# Patient Record
Sex: Female | Born: 1975 | Race: Black or African American | Hispanic: No | Smoking: Never smoker
Health system: Southern US, Community
[De-identification: ages and names within clinical notes are randomized; demographics above are authoritative.]

## PROBLEM LIST (undated history)

## (undated) DIAGNOSIS — E119 Type 2 diabetes mellitus without complications: Secondary | ICD-10-CM

## (undated) HISTORY — PX: TONSILLECTOMY: SUR1361

## (undated) HISTORY — PX: CHOLECYSTECTOMY: SHX55

## (undated) HISTORY — PX: TUBAL LIGATION: SHX77

---

## 2020-07-28 ENCOUNTER — Emergency Department
Admission: RE | Admit: 2020-07-28 | Discharge: 2020-07-28 | Disposition: A | Discharge status: Home / Self Care | Payer: Self-pay | Source: Ambulatory Visit | Attending: Family Medicine | Admitting: Family Medicine

## 2020-07-28 ENCOUNTER — Other Ambulatory Visit: Payer: Self-pay

## 2020-07-28 ENCOUNTER — Emergency Department (INDEPENDENT_AMBULATORY_CARE_PROVIDER_SITE_OTHER): Payer: Self-pay

## 2020-07-28 VITALS — BP 136/87 | HR 85 | Temp 98.2°F | Resp 16 | Ht 61.0 in | Wt 170.0 lb

## 2020-07-28 DIAGNOSIS — K760 Fatty (change of) liver, not elsewhere classified: Secondary | ICD-10-CM

## 2020-07-28 DIAGNOSIS — D219 Benign neoplasm of connective and other soft tissue, unspecified: Secondary | ICD-10-CM

## 2020-07-28 DIAGNOSIS — D259 Leiomyoma of uterus, unspecified: Secondary | ICD-10-CM

## 2020-07-28 DIAGNOSIS — R1031 Right lower quadrant pain: Secondary | ICD-10-CM

## 2020-07-28 DIAGNOSIS — K429 Umbilical hernia without obstruction or gangrene: Secondary | ICD-10-CM

## 2020-07-28 HISTORY — DX: Type 2 diabetes mellitus without complications: E11.9

## 2020-07-28 LAB — POCT URINALYSIS DIP (MANUAL ENTRY)
Bilirubin, UA: NEGATIVE
Blood, UA: NEGATIVE
Glucose, UA: NEGATIVE mg/dL
Ketones, POC UA: NEGATIVE mg/dL
Leukocytes, UA: NEGATIVE
Nitrite, UA: NEGATIVE
Protein Ur, POC: NEGATIVE mg/dL
Spec Grav, UA: >=1.03 — AB (ref 1.010–1.025)
Urobilinogen, UA: 0.2 E.U./dL
pH, UA: 5.5 (ref 5.0–8.0)

## 2020-07-28 LAB — I-STAT CREATININE (MANUAL ENTRY): Creatinine, Ser: 0.8 (ref 0.50–1.10)

## 2020-07-28 MED ORDER — KETOROLAC TROMETHAMINE 60 MG/2ML IM SOLN
60.0000 mg | Freq: Once | INTRAMUSCULAR | Status: AC
  Administered 2020-07-28: 60 mg via INTRAMUSCULAR

## 2020-07-28 MED ORDER — IOHEXOL 9 MG/ML PO SOLN: 500.0000 mL | ORAL | Status: AC

## 2020-07-28 MED ORDER — IOHEXOL 300 MG/ML  SOLN
100.0000 mL | Freq: Once | INTRAMUSCULAR | Status: AC | PRN
  Administered 2020-07-28: 100 mL via INTRAVENOUS

## 2020-07-28 NOTE — ED Notes (Signed)
Pt's labs to be drawn after PIV placed for CT - per pt she has small veins & is afraid of needles- labs are a send out - Dr Assunta Found aware labs will be drawn after CT - scheduled for 1500. Pt also requested RN update her son vis phone with testing status. Pt is unable to get a signal in room. RN spoke w/ Tino who plans on leaving to pick up children from school at [place] and will return afterwards. Patient updated on plan

## 2020-07-28 NOTE — ED Provider Notes (Signed)
[name]    CSN: [id number] Arrival date & time: 07/28/20  1153      History   Chief Complaint No chief complaint on file.   HPI Shelley Smith is a 45 y.o. female.   Patient recently moved to this area and does not yet have a PCP.  She complains of onset of sporadic right lower quadrant abdominal pain about 4 months ago.  She describes the pain as a "punching" sensation.  The pain has become worse, and constant, during the past two weeks, often awaking her.  She complains of post-prandial bloating but denies nausea/vomiting and bowel movements have been normal except for minimal constipation.  She denies urinary symptoms.  Patient's last menstrual period was 07/16/2020 (exact date).  She notes that her periods have become increasingly heavy during the past several months.  Ibuprofen improves her symptoms. Past surgical history includes three C-sections (most recent in 2005).  Five years ago she had a cholecystectomy with post-op complication of a lower abdominal abscess requiring repeat surgery.  She had COVID19 pneumonia in February 2020.  The history is provided by the patient.  Abdominal Pain Pain location:  RLQ Pain quality: bloating and squeezing   Pain radiates to:  Does not radiate Pain severity:  Moderate Onset quality:  Gradual Duration:  2 weeks Timing:  Constant Progression:  Worsening Chronicity:  New Context: awakening from sleep, eating and previous surgery   Context: not diet changes, not laxative use, not recent illness and not trauma   Relieved by:  Nothing Worsened by:  Palpation and movement Ineffective treatments:  NSAIDs Associated symptoms: constipation   Associated symptoms: no anorexia, no belching, no chest pain, no chills, no cough, no diarrhea, no dysuria, no fatigue, no fever, no flatus, no hematemesis, no hematochezia, no hematuria, no melena, no nausea, no shortness of breath, no vaginal bleeding, no vaginal discharge and no vomiting    Risk factors: multiple surgeries and obesity     Past Medical History:  Diagnosis Date  . Diabetes mellitus without complication ([place])     There are no problems to display for this patient.   Past Surgical History:  Procedure Laterality Date  . CESAREAN SECTION    . CHOLECYSTECTOMY    . TONSILLECTOMY      OB History   No obstetric history on file.      Home Medications    Prior to Admission medications   Not on File    Family History History reviewed. No pertinent family history.  Social History Social History   Tobacco Use  . Smoking status: Never Smoker  . Smokeless tobacco: Never Used  Vaping Use  . Vaping Use: Never used  Substance Use Topics  . Alcohol use: Not Currently     Allergies   Codeine   Review of Systems Review of Systems  Constitutional: Negative for activity change, appetite change, chills, diaphoresis, fatigue, fever and unexpected weight change.  HENT: Negative.   Eyes: Negative.   Respiratory: Negative for cough, chest tightness and shortness of breath.   Cardiovascular: Negative for chest pain and leg swelling.  Gastrointestinal: Positive for abdominal pain and constipation. Negative for abdominal distention, anorexia, blood in stool, diarrhea, flatus, hematemesis, hematochezia, melena, nausea and vomiting.  Genitourinary: Negative for dysuria, hematuria, menstrual problem, pelvic pain, vaginal bleeding and vaginal discharge.  Musculoskeletal: Negative.   Skin: Negative.   Neurological: Negative for headaches.  Hematological: Negative for adenopathy.     Physical Exam Triage Vital Signs ED  Triage Vitals  Enc Vitals Group     BP 07/28/20 1212 136/87     Pulse Rate 07/28/20 1212 71     Resp 07/28/20 1212 18     Temp 07/28/20 1212 98.2 F (36.8 C)     Temp Source 07/28/20 1212 Oral     SpO2 07/28/20 1212 96 %     Weight 07/28/20 1213 170 lb (77.1 kg)     Height 07/28/20 1213 5\' 1"  (1.549 m)     Head Circumference --       Peak Flow --      Pain Score 07/28/20 1212 8     Pain Loc --      Pain Edu? --      Excl. in [place]? --    No data found.  Updated Vital Signs BP 136/87 (BP Location: Right Arm)   Pulse 71   Temp 98.2 F (36.8 C) (Oral)   Resp 18   Ht 5\' 1"  (1.549 m)   Wt 77.1 kg   LMP 07/16/2020 (Exact Date)   SpO2 96%   BMI 32.12 kg/m   Visual Acuity Right Eye Distance:   Left Eye Distance:   Bilateral Distance:    Right Eye Near:   Left Eye Near:    Bilateral Near:     Physical Exam Vitals and nursing note reviewed.  Constitutional:      General: She is not in acute distress.    Appearance: She is obese.  HENT:     Head: Normocephalic.     Nose: Nose normal.     Mouth/Throat:     Mouth: Mucous membranes are moist.     Pharynx: Oropharynx is clear.  Eyes:     Conjunctiva/sclera: Conjunctivae normal.     Pupils: Pupils are equal, round, and reactive to light.  Cardiovascular:     Rate and Rhythm: Normal rate and regular rhythm.     Heart sounds: Normal heart sounds.  Pulmonary:     Breath sounds: Normal breath sounds.  Abdominal:     General: Bowel sounds are normal. There is no distension.     Palpations: Abdomen is soft. There is no hepatomegaly, splenomegaly or mass.     Tenderness: There is abdominal tenderness in the right lower quadrant and suprapubic area. There is no right CVA tenderness, left CVA tenderness, guarding or rebound. Positive signs include McBurney's sign. Negative signs include psoas sign and obturator sign.     Hernia: No hernia is present. There is no hernia in the umbilical area or ventral area.       Comments: Lower abdomen has tenderness to palpation and fullness over the suprapubic area, extending to the right lower quadrant.  Musculoskeletal:     Cervical back: Neck supple.     Right lower leg: No edema.     Left lower leg: No edema.  Lymphadenopathy:     Cervical: No cervical adenopathy.  Skin:    General: Skin is warm and dry.      Findings: No rash.  Neurological:     Mental Status: She is alert and oriented to person, place, and time.      UC Treatments / Results  Labs (all labs ordered are listed, but only abnormal results are displayed) Labs Reviewed  POCT URINALYSIS DIP (MANUAL ENTRY) - Abnormal; Notable for the following components:      Result Value   Spec Grav, UA >=1.030 (*)    All other components within normal limits  CBC WITH DIFFERENTIAL/PLATELET  COMPLETE METABOLIC PANEL WITH GFR  HEMOGLOBIN A1C    EKG   Radiology CT ABDOMEN PELVIS W CONTRAST  Result Date: 07/28/2020 CLINICAL DATA:  Intermittent pain in the abdomen. EXAM: CT ABDOMEN AND PELVIS WITH CONTRAST TECHNIQUE: Multidetector CT imaging of the abdomen and pelvis was performed using the standard protocol following bolus administration of intravenous contrast. CONTRAST:  18mL OMNIPAQUE IOHEXOL 300 MG/ML  SOLN COMPARISON:  None. FINDINGS: Lower chest: No acute abnormality. Hepatobiliary: The liver is hypoattenuating relative to the spleen, which may reflect hepatic steatosis. No focal liver abnormality is seen. The gallbladder is surgically absent. No biliary dilatation. Pancreas: Unremarkable. No pancreatic ductal dilatation or surrounding inflammatory changes. Spleen: Normal in size without focal abnormality. Adrenals/Urinary Tract: Adrenal glands are unremarkable. Kidneys are normal, without renal calculi, focal lesion, or hydronephrosis. Bladder is unremarkable. Stomach/Bowel: Stomach is within normal limits. Appendix appears normal. No evidence of bowel wall thickening, distention, or inflammatory changes. Vascular/Lymphatic: No significant vascular findings are present. No enlarged abdominal or pelvic lymph nodes. Reproductive: The uterus is markedly enlarged measuring approximately 12.8 x 13.7 (series 2, image 51) x 14.0 cm (series 6, image 51) with multiple uterine masses, consistent with uterine fibroids. Other: There is a fat containing  paraumbilical hernia. No free intraperitoneal fluid. Musculoskeletal: No acute or significant osseous findings. IMPRESSION: 1. Markedly enlarged uterus with multiple uterine fibroids. 2. Fat containing paraumbilical hernia. 3. Hepatic steatosis. Electronically Signed   By: Zerita Boers M.D.   On: 07/28/2020 16:35    Procedures Procedures (including critical care time)  Medications Ordered in UC Medications  ketorolac (TORADOL) injection 60 mg (60 mg Intramuscular Given 07/28/20 1420)    Initial Impression / Assessment and Plan / UC Course  I have reviewed the triage vital signs and the nursing notes.  Pertinent labs & imaging results that were available during my care of the patient were reviewed by me and considered in my medical decision making (see chart for details).    No significant findings on CT abd/pelvis other than multiple uterine fibroids which is most likely the source of patient's symptoms. CBC and CMP pending.  Hgb A1C at patient's request. Recommend follow-up with [facility], and a PCP to establish care as soon as possible.    Final Clinical Impressions(s) / UC Diagnoses   Final diagnoses:  Right lower quadrant abdominal pain  Fibroids     Discharge Instructions     May take Ibuprofen 200mg , 4 tabs every 8 hours with food as needed for pain.   ED Prescriptions    None        Kandra Nicolas, MD 07/29/20 2124

## 2020-07-28 NOTE — Discharge Instructions (Addendum)
May take Ibuprofen 200mg , 4 tabs every 8 hours with food as needed for pain.

## 2020-07-28 NOTE — ED Triage Notes (Signed)
Abdominal pain, Lower right quadrant x 2 weeks intermittently, mostly at night,  bloating after eating, does not have a gall bladder. Denies constipation, diarrhea or urinary sx.

## 2020-07-29 LAB — COMPLETE METABOLIC PANEL WITH GFR
AG Ratio: 1.4 (calc) (ref 1.0–2.5)
ALT: 16 U/L (ref 6–29)
AST: 13 U/L (ref 10–35)
Albumin: 4 g/dL (ref 3.6–5.1)
Alkaline phosphatase (APISO): 44 U/L (ref 31–125)
BUN: 11 mg/dL (ref 7–25)
CO2: 23 mmol/L (ref 20–32)
Calcium: 8.8 mg/dL (ref 8.6–10.2)
Chloride: 99 mmol/L (ref 98–110)
Creat: 0.85 mg/dL (ref 0.50–1.10)
GFR, Est African American: 95 mL/min/{1.73_m2} (ref >=60)
GFR, Est Non African American: 82 mL/min/{1.73_m2} (ref >=60)
Globulin: 2.8 g/dL (calc) (ref 1.9–3.7)
Glucose, Bld: 87 mg/dL (ref 65–99)
Potassium: 4.1 mmol/L (ref 3.5–5.3)
Sodium: 134 mmol/L — ABNORMAL LOW (ref 135–146)
Total Bilirubin: 0.5 mg/dL (ref 0.2–1.2)
Total Protein: 6.8 g/dL (ref 6.1–8.1)

## 2020-07-29 LAB — CBC WITH DIFFERENTIAL/PLATELET
Absolute Monocytes: 455 cells/uL (ref 200–950)
Basophils Absolute: 36 cells/uL (ref 0–200)
Basophils Relative: 0.4 %
Eosinophils Absolute: 82 cells/uL (ref 15–500)
Eosinophils Relative: 0.9 %
HCT: 30.3 % — ABNORMAL LOW (ref 35.0–45.0)
Hemoglobin: 9.6 g/dL — ABNORMAL LOW (ref 11.7–15.5)
Lymphs Abs: 3322 cells/uL (ref 850–3900)
MCH: 24.5 pg — ABNORMAL LOW (ref 27.0–33.0)
MCHC: 31.7 g/dL — ABNORMAL LOW (ref 32.0–36.0)
MCV: 77.3 fL — ABNORMAL LOW (ref 80.0–100.0)
MPV: 9.8 fL (ref 7.5–12.5)
Monocytes Relative: 5 %
Neutro Abs: 5205 cells/uL (ref 1500–7800)
Neutrophils Relative %: 57.2 %
Platelets: 433 10*3/uL — ABNORMAL HIGH (ref 140–400)
RBC: 3.92 10*6/uL (ref 3.80–5.10)
RDW: 15.2 % — ABNORMAL HIGH (ref 11.0–15.0)
Total Lymphocyte: 36.5 %
WBC: 9.1 10*3/uL (ref 3.8–10.8)

## 2020-07-29 LAB — HEMOGLOBIN A1C
Hgb A1c MFr Bld: 7.1 % of total Hgb — ABNORMAL HIGH (ref <5.7)
Mean Plasma Glucose: 157 mg/dL
eAG (mmol/L): 8.7 mmol/L

## 2020-08-20 ENCOUNTER — Other Ambulatory Visit (HOSPITAL_COMMUNITY)
Admission: RE | Admit: 2020-08-20 | Discharge: 2020-08-20 | Disposition: A | Discharge status: Home / Self Care | Payer: Medicaid Other | Source: Ambulatory Visit | Attending: Obstetrics and Gynecology | Admitting: Obstetrics and Gynecology

## 2020-08-20 ENCOUNTER — Encounter: Payer: Self-pay | Admitting: Obstetrics and Gynecology

## 2020-08-20 ENCOUNTER — Other Ambulatory Visit: Payer: Self-pay

## 2020-08-20 ENCOUNTER — Ambulatory Visit (INDEPENDENT_AMBULATORY_CARE_PROVIDER_SITE_OTHER): Payer: Medicaid Other | Admitting: Obstetrics and Gynecology

## 2020-08-20 VITALS — BP 146/81 | HR 49 | Ht 61.0 in | Wt 176.0 lb

## 2020-08-20 DIAGNOSIS — Z01411 Encounter for gynecological examination (general) (routine) with abnormal findings: Secondary | ICD-10-CM | POA: Diagnosis not present

## 2020-08-20 DIAGNOSIS — Z01419 Encounter for gynecological examination (general) (routine) without abnormal findings: Secondary | ICD-10-CM | POA: Diagnosis not present

## 2020-08-20 DIAGNOSIS — D259 Leiomyoma of uterus, unspecified: Secondary | ICD-10-CM | POA: Diagnosis not present

## 2020-08-20 NOTE — Progress Notes (Signed)
Subjective:     Shelley Smith is a 45 y.o. female P3 with LMP 07/16/20 and BMI 33 who is here for a comprehensive physical exam. The patient reports chronic pelvic pain intermittently. Patient states the pain has been present for years. She was recently seen in urgent care and was informed of a fibroid uterus. She reports a monthly period lasting 7 days associated with dysmenorrhea. Patient is sexually active using BTL for contraception. She denies any abnormal discharge. Patient is without any other complaints.   Past Medical History:  Diagnosis Date  . Diabetes mellitus without complication [facility])    Past Surgical History:  Procedure Laterality Date  . CESAREAN SECTION    . CHOLECYSTECTOMY    . TONSILLECTOMY    . TUBAL LIGATION     Family History  Problem Relation Age of Onset  . Cancer Maternal Uncle      Social History   Socioeconomic History  . Marital status: Single    Spouse name: Not on file  . Number of children: Not on file  . Years of education: Not on file  . Highest education level: Not on file  Occupational History  . Not on file  Tobacco Use  . Smoking status: Never Smoker  . Smokeless tobacco: Never Used  Vaping Use  . Vaping Use: Never used  Substance and Sexual Activity  . Alcohol use: Not Currently  . Drug use: Never  . Sexual activity: Yes    Birth control/protection: Surgical  Other Topics Concern  . Not on file  Social History Narrative  . Not on file   Social Determinants of Health   Financial Resource Strain: Not on file  Food Insecurity: Not on file  Transportation Needs: Not on file  Physical Activity: Not on file  Stress: Not on file  Social Connections: Not on file  Intimate Partner Violence: Not on file   Health Maintenance  Topic Date Due  . Hepatitis C Screening  Never done  . COVID-19 Vaccine (1) Never done  . HIV Screening  Never done  . TETANUS/TDAP  Never done  . PAP SMEAR-Modifier  Never done  . COLONOSCOPY (Pts  45-55yrs Insurance coverage will need to be confirmed)  Never done  . INFLUENZA VACCINE  Never done  . HPV VACCINES  Aged Out       Review of Systems Pertinent items noted in HPI and remainder of comprehensive ROS otherwise negative.   Objective:  Blood pressure (!) 146/81, pulse (!) 49, height 5\' 1"  (1.549 m), weight 176 lb (79.8 kg), last menstrual period 08/10/2020.     GENERAL: Well-developed, well-nourished female in no acute distress.  HEENT: Normocephalic, atraumatic. Sclerae anicteric.  NECK: Supple. Normal thyroid.  LUNGS: Clear to auscultation bilaterally.  HEART: Regular rate and rhythm. BREASTS: Symmetric in size. No palpable masses or lymphadenopathy, skin changes, or nipple drainage. ABDOMEN: Soft, nontender, nondistended. No organomegaly. palpable fibroid uterus. Vertical skin incision from c-section PELVIC: Normal external female genitalia. Vagina is pink and rugated.  Normal discharge. Normal appearing cervix. Uterus is18-weeks in size.  No adnexal mass or tenderness. EXTREMITIES: No cyanosis, clubbing, or edema, 2+ distal pulses.    Assessment:    Healthy female exam.      Plan:    Pap smear collected Screening mammogram ordered Pelvic ultrasound ordered Patient hesitant on surgical intervention. Pending ultrasound findings, discussed management with uterine artery embolization vs hysterectomy. Patient is interested in robotic hysterectomy if it is an option Patient will be contacted  with ultrasound report and further management Patient will be contacted with other abnormal results  See After Visit Summary for Counseling Recommendations

## 2020-08-20 NOTE — Patient Instructions (Signed)
Uterine Artery Embolization for Fibroids  Uterine artery embolization is a procedure to shrink uterine fibroids. Uterine fibroids are masses of tissue (tumors) that can develop in the womb (uterus). They are also called leiomyomas. This type of tumor is not cancerous (benign) and does not spread to other parts of the body outside of the pelvic area. The pelvic area is the part of the body between the hip bones. You can have one or many fibroids. Fibroids can vary in size, shape, weight, and where they grow in the uterus. Some can become quite large. In this procedure, a thin plastic tube (catheter) is used to inject a chemical that blocks off the blood supply to the fibroid, which causes the fibroid to shrink. Tell a health care provider about:  Any allergies you have.  All medicines you are taking, including vitamins, herbs, eye drops, creams, and over-the-counter medicines.  Any problems you or family members have had with anesthetic medicines.  Any blood disorders you have.  Any surgeries you have had.  Any medical conditions you have.  Whether you are pregnant or may be pregnant. What are the risks? Generally, this is a safe procedure. However, problems may occur, including:  Bleeding.  Allergic reactions to medicines or dyes.  Damage to other structures or organs.  Infection, including blood infection (septicemia).  Injury to the uterus from decreased blood supply.  Lack of menstrual periods (amenorrhea).  Death of tissue cells (necrosis) around your bladder or vulva.  Development of a hole between organs or from an organ to the surface of your skin (fistula).  Blood clot in the legs (deep vein thrombosis) or lung (pulmonary embolus).  Nausea and vomiting. What happens before the procedure? Staying hydrated Follow instructions from your health care provider about hydration, which may include:  Up to 2 hours before the procedure - you may continue to drink clear  liquids, such as water, clear fruit juice, black coffee, and plain tea. Eating and drinking restrictions Follow instructions from your health care provider about eating and drinking, which may include:  8 hours before the procedure - stop eating heavy meals or foods such as meat, fried foods, or fatty foods.  6 hours before the procedure - stop eating light meals or foods, such as toast or cereal.  6 hours before the procedure - stop drinking milk or drinks that contain milk.  2 hours before the procedure - stop drinking clear liquids. Medicines  Ask your health care provider about: ? Changing or stopping your regular medicines. This is especially important if you are taking diabetes medicines or blood thinners. ? Taking over-the-counter medicines, vitamins, herbs, and supplements. ? Taking medicines such as aspirin and ibuprofen. These medicines can thin your blood. Do not take these medicines unless your health care provider tells you to take them.  You may be given antibiotic medicine to help prevent infection.  You may be given medicine to prevent nausea and vomiting (antiemetic). General instructions  Ask your health care provider how your surgical site will be marked or identified.  You may be asked to shower with a germ-killing soap.  Plan to have someone take you home from the hospital or clinic.  If you will be going home right after the procedure, plan to have someone with you for 24 hours.  You will be asked to empty your bladder. What happens during the procedure?  To lower your risk of infection: ? Your health care team will wash or sanitize their hands. ?   to help you relax (sedative). A medicine to numb the area (local anesthetic). A small cut (incision) will be made in your groin. A catheter will be inserted into the main artery of your leg. The catheter  will be guided through the artery to your uterus. A series of images will be taken while dye is injected through the catheter in your groin. X-rays are taken at the same time. This is done to provide a road map of the blood supply to your uterus and fibroids. Tiny plastic spheres, about the size of sand grains, will be injected through the catheter. Metal coils may be used to help block the artery. The particles will lodge in tiny branches of the uterine artery that supplies blood to the fibroids. The procedure will be repeated on the artery that supplies the other side of the uterus. The catheter will be removed and pressure will be applied to stop the bleeding. A dressing will be placed over the incision. The procedure may vary among health care providers and hospitals. What happens after the procedure? Your blood pressure, heart rate, breathing rate, and blood oxygen level will be monitored until the medicines you were given have worn off. You will be given pain medicine as needed. You may be given medicine for nausea and vomiting as needed. Do not drive for 24 hours if you were given a sedative. Summary Uterine artery embolization is a procedure to shrink uterine fibroids by blocking the blood supply to the fibroid. You may be given a sedative and local anesthetic for the procedure. A catheter will be inserted into the main artery of your leg. The catheter will be guided through the artery to your uterus. After the procedure you will be given pain medicine and medicine for nausea as needed. Do not drive for 24 hours if you were given a sedative. This information is not intended to replace advice given to you by your health care provider. Make sure you discuss any questions you have with your healthcare provider. Document Revised: 05/12/2017 Document Reviewed: 09/01/2016 Elsevier Patient Education  2021 [employer] Hysterectomy Information  A hysterectomy is a surgery in which the uterus  is removed. The lowest part of the uterus (cervix), which opens into the vagina, may be removed as well. In some cases, the fallopian tubes, the ovaries, or both the fallopian tubes and the ovaries may also be removed. This procedure may be done to treat different medical problems. It may also be done to help transgender men feel more masculine. After the procedure, a woman will no longer have menstrual periods and will not be able to become pregnant (sterile). What are the reasons for a hysterectomy? There are many reasons why a person might have this procedure. They include: Persistent, abnormal vaginal bleeding. Long-term (chronic) pelvic pain or infection. Endometriosis. This is when the lining of the uterus (endometrium) starts to grow outside the uterus. Adenomyosis. This is when the endometrium starts to grow in the muscle of the uterus. Pelvic organ prolapse. This is a condition in which the uterus falls down into the vagina. Noncancerous growths in the uterus (uterine fibroids) that cause symptoms. The presence of precancerous cells. Cervical or uterine cancer. Sex change. This helps a transgender man complete his female identity. What are the different types of hysterectomy? There are three different types of hysterectomy: Supracervical hysterectomy. In this type, the top part of the uterus is removed, but not the cervix. Total hysterectomy. In this type, the uterus and cervix   are removed. Radical hysterectomy. In this type, the uterus, the cervix, and the tissue that holds the uterus in place (parametrium) are removed. What are the different ways a hysterectomy can be performed? There are many different ways a hysterectomy can be performed, including: Abdominal hysterectomy. In this type, an incision is made in the abdomen. The uterus is removed through this incision. Vaginal hysterectomy. In this type, an incision is made in the vagina. The uterus is removed through this incision.  There are no abdominal incisions. Conventional laparoscopic hysterectomy. In this type, 3 or 4 small incisions are made in the abdomen. A thin, lighted tube with a camera (laparoscope) is inserted into one of the incisions. Other tools are put through the other incisions. The uterus is cut into small pieces. The small pieces are removed through the incisions or the vagina. Laparoscopically assisted vaginal hysterectomy (LAVH). In this type, 3 or 4 small incisions are made in the abdomen. Part of the surgery is performed laparoscopically and the other part is done vaginally. The uterus is removed through the vagina. Robot-assisted laparoscopic hysterectomy. In this type, a laparoscope and other tools are inserted into 3 or 4 small incisions in the abdomen. A computer-controlled device is used to give the surgeon a 3D image and to help control the surgical instruments. This allows for more precise movements of surgical instruments. The uterus is cut into small pieces and removed through the incisions or the vagina. Discuss the options with your health care provider to determine which type isthe right one for you. What are the risks of this surgery? Generally, this is a safe procedure. However, problems may occur, including: Bleeding and risk of blood transfusion. Tell your health care provider if you do not want to receive any blood products. Blood clots in the legs or lung. Infection. Damage to nearby structures or organs. Allergic reactions to medicines. Having to change to an abdominal hysterectomy after starting a less invasive technique. What to expect after a hysterectomy You will be given pain medicine. You may need to stay in the hospital for 1-2 days to recover, depending on the type of hysterectomy you had. You will need to have someone with you for the first 3-5 days after you go home. You will need to follow up with your surgeon in 2-4 weeks after surgery to evaluate your progress. If the  ovaries are removed, you will have early menopause symptoms such as hot flashes, night sweats, and insomnia. If you had a hysterectomy for a problem that was not cancer or a condition that could not lead to cancer, then you no longer need Pap tests. However, even if you no longer need a Pap test, get regular pelvic exams to make sure no otherproblems are developing. Questions to ask your health care provider Is a hysterectomy medically necessary? Do I have other treatment options for my condition? What are my options for hysterectomy procedure? What organs and tissues need to be removed? What are the risks? What are the benefits? How long will I need to stay in the hospital after the procedure? How long will I need to recover at home? What symptoms can I expect after the procedure? Summary A hysterectomy is a surgery in which the uterus is removed. The fallopian tubes, the ovaries, or both may be removed as well. This procedure may be done to treat different medical problems. It may also be done to complete your female identity during a sex change. After the procedure, a woman   will no longer have a menstrual period and will not be able to become pregnant. There are three types of hysterectomy. Discuss with your health care provider which options are right for you. This is a safe procedure, though there are some risks. Risks include infection, bleeding, blood clots, or damage to nearby organs. This information is not intended to replace advice given to you by your health care provider. Make sure you discuss any questions you have with your healthcare provider. Document Revised: 03/29/2019 Document Reviewed: 03/29/2019 Elsevier Patient Education  2021 [employer] Inc.  

## 2020-08-21 ENCOUNTER — Ambulatory Visit (INDEPENDENT_AMBULATORY_CARE_PROVIDER_SITE_OTHER): Payer: Self-pay

## 2020-08-21 ENCOUNTER — Ambulatory Visit (INDEPENDENT_AMBULATORY_CARE_PROVIDER_SITE_OTHER): Payer: Medicaid Other

## 2020-08-21 ENCOUNTER — Other Ambulatory Visit: Payer: Self-pay | Admitting: Obstetrics and Gynecology

## 2020-08-21 DIAGNOSIS — D259 Leiomyoma of uterus, unspecified: Secondary | ICD-10-CM

## 2020-08-21 DIAGNOSIS — Z1231 Encounter for screening mammogram for malignant neoplasm of breast: Secondary | ICD-10-CM

## 2020-08-21 DIAGNOSIS — Z01411 Encounter for gynecological examination (general) (routine) with abnormal findings: Secondary | ICD-10-CM

## 2020-08-24 ENCOUNTER — Other Ambulatory Visit: Payer: Self-pay | Admitting: Obstetrics and Gynecology

## 2020-08-24 DIAGNOSIS — D259 Leiomyoma of uterus, unspecified: Secondary | ICD-10-CM

## 2020-08-25 LAB — CYTOLOGY - PAP
Adequacy: ABSENT
Comment: NEGATIVE
Diagnosis: NEGATIVE
High risk HPV: NEGATIVE

## 2020-10-20 ENCOUNTER — Other Ambulatory Visit: Payer: Self-pay | Admitting: Obstetrics and Gynecology

## 2020-10-20 DIAGNOSIS — D259 Leiomyoma of uterus, unspecified: Secondary | ICD-10-CM

## 2020-10-22 ENCOUNTER — Other Ambulatory Visit: Payer: Self-pay | Admitting: Interventional Radiology

## 2020-10-22 ENCOUNTER — Encounter: Payer: Self-pay | Admitting: *Deleted

## 2020-10-22 ENCOUNTER — Ambulatory Visit
Admission: RE | Admit: 2020-10-22 | Discharge: 2020-10-22 | Disposition: A | Discharge status: Home / Self Care | Payer: Medicaid Other | Source: Ambulatory Visit | Attending: Obstetrics and Gynecology | Admitting: Obstetrics and Gynecology

## 2020-10-22 DIAGNOSIS — D259 Leiomyoma of uterus, unspecified: Secondary | ICD-10-CM

## 2020-10-22 HISTORY — PX: IR RADIOLOGIST EVAL & MGMT: IMG5224

## 2020-10-22 NOTE — Consult Note (Signed)
Chief Complaint:  Symptomatic uterine fibroid  Referring Physician(s): Smith,Shelley  History of Present Illness: Shelley Smith is a 45 y.o. female With symptomatic uterine fibroids and dysfunctional uterine bleeding.  Patient is a G4, P3 A1.  No future pregnancy plans.  Previous tubal ligation.  Review of her menstrual cycle performed last menstrual cycle for 322.  Frequency is every month.  Cycle length is 7 days with 5 heavy days requiring frequent pad change.  No intra-.  Bleeding.  No previous fibroid surgeries or removals.  She also has bulk related symptoms including abdominal pelvic cramping, bloating, and associated decreased appetite.  Pelvic pain is more pronounced in the right lower quadrant.  She is not on any birth control pills or hormone replacement therapies.  No recent GYN infection or antibiotic course.  Last Pap smear 08/20/2020 was negative.  Ultrasound and CT both confirm enlarged fibroid uterus.  Past Medical History:  Diagnosis Date  . Diabetes mellitus without complication [facility])     Past Surgical History:  Procedure Laterality Date  . CESAREAN SECTION    . CHOLECYSTECTOMY    . IR RADIOLOGIST EVAL & MGMT  10/22/2020  . TONSILLECTOMY    . TUBAL LIGATION      Allergies: Codeine  Medications: Prior to Admission medications   Medication Sig Start Date End Date Taking? Authorizing Provider  ferrous sulfate 324 MG TBEC Take 324 mg by mouth.    [provider]     Family History  Problem Relation Age of Onset  . Cancer Maternal Uncle     Social History   Socioeconomic History  . Marital status: Single    Spouse name: Not on file  . Number of children: Not on file  . Years of education: Not on file  . Highest education level: Not on file  Occupational History  . Not on file  Tobacco Use  . Smoking status: Never Smoker  . Smokeless tobacco: Never Used  Vaping Use  . Vaping Use: Never used  Substance and Sexual Activity  .  Alcohol use: Not Currently  . Drug use: Never  . Sexual activity: Yes    Birth control/protection: Surgical  Other Topics Concern  . Not on file  Social History Narrative  . Not on file   Social Determinants of Health   Financial Resource Strain: Not on file  Food Insecurity: Not on file  Transportation Needs: Not on file  Physical Activity: Not on file  Stress: Not on file  Social Connections: Not on file     Review of Systems: A 12 point ROS discussed and pertinent positives are indicated in the HPI above.  All other systems are negative.  Review of Systems  Vital Signs: Pulse (!) 57   SpO2 99%   Physical Exam Constitutional:      General: She is not in acute distress.    Appearance: She is not toxic-appearing or diaphoretic.  Eyes:     General: No scleral icterus.    Conjunctiva/sclera: Conjunctivae normal.  Cardiovascular:     Rate and Rhythm: Normal rate and regular rhythm.     Heart sounds: Normal heart sounds.  Pulmonary:     Breath sounds: Normal breath sounds.  Abdominal:     General: Bowel sounds are normal.     Palpations: Abdomen is soft.     Comments: Palpable firm fibroid uterus, asymmetric to the right.  Approximate uterine size 17 to 18 weeks.  Musculoskeletal:  General: No swelling.  Skin:    General: Skin is warm and dry.  Neurological:     General: No focal deficit present.     Mental Status: Mental status is at baseline.  Psychiatric:        Mood and Affect: Mood normal.     Imaging: IR Radiologist Eval & Mgmt  Result Date: 10/22/2020 Please refer to notes tab for details about interventional procedure. (Op Note)   Labs:  CBC: Recent Labs    07/28/20 1556  WBC 9.1  HGB 9.6*  HCT 30.3*  PLT 433*    COAGS: No results for input(s): INR, APTT in the last 8760 hours.  BMP: Recent Labs    07/28/20 1543 07/28/20 1556  NA  --  134*  K  --  4.1  CL  --  99  CO2  --  23  GLUCOSE  --  87  BUN  --  11  CALCIUM  --   8.8  CREATININE 0.80 0.85  GFRNONAA  --  82  GFRAA  --  95    LIVER FUNCTION TESTS: Recent Labs    07/28/20 1556  BILITOT 0.5  AST 13  ALT 16  PROT 6.8     Assessment and Plan:  Symptomatic uterine fibroids with dysfunctional uterine bleeding, associated bulk related symptoms with abdominal pelvic cramps, pain and bloating.  Uterine fibroid embolization procedure was reviewed in detail including the procedure, risk, benefits and alternatives.  The expected goals, recovery phase and outcomes reviewed.  She understands this can be done under conscious sedation and avoid general anesthesia with an expected acute recovery phase of approximately 7 to 10 days.  She also knows this requires an overnight recovery.  All questions addressed.  She would like to proceed with the work-up.  Plan: Schedule for outpatient pelvic MRI without and with contrast as soon as possible to assess fibroid anatomy for embolization.  Thank you for this interesting consult.  I greatly enjoyed meeting Shelley Smith and look forward to participating in their care.  A copy of this report was sent to the requesting provider on this date.  Electronically Signed: Greggory Keen 10/22/2020, 2:56 PM   I spent a total of  40 Minutes   in face to face in clinical consultation, greater than 50% of which was counseling/coordinating care for this patient with dysfunctional uterine bleeding and uterine fibroids

## 2020-10-23 ENCOUNTER — Telehealth: Payer: Self-pay

## 2020-10-23 MED ORDER — DIAZEPAM 5 MG PO TABS: 5.0000 mg | ORAL_TABLET | ORAL | 0 refills | Status: DC | PRN

## 2020-10-23 NOTE — Telephone Encounter (Signed)
Valium called into patients pharmacy for her to take prior to MRI on 11/02/20, per Dr. Annamaria Boots. Pt aware of prescription instructions and instructed to have a driver the day of taking the medication as it may cause drowsiness. Pt verbalized understanding.

## 2020-10-30 ENCOUNTER — Emergency Department
Admission: EM | Admit: 2020-10-30 | Discharge: 2020-10-30 | Disposition: A | Discharge status: Home / Self Care | Payer: Medicaid Other | Source: Home / Self Care

## 2020-10-30 ENCOUNTER — Encounter: Payer: Self-pay | Admitting: Emergency Medicine

## 2020-10-30 DIAGNOSIS — R1030 Lower abdominal pain, unspecified: Secondary | ICD-10-CM | POA: Diagnosis not present

## 2020-10-30 DIAGNOSIS — R52 Pain, unspecified: Secondary | ICD-10-CM

## 2020-10-30 DIAGNOSIS — K5909 Other constipation: Secondary | ICD-10-CM

## 2020-10-30 DIAGNOSIS — R509 Fever, unspecified: Secondary | ICD-10-CM | POA: Diagnosis not present

## 2020-10-30 LAB — POCT URINALYSIS DIP (MANUAL ENTRY)
Bilirubin, UA: NEGATIVE
Blood, UA: NEGATIVE
Glucose, UA: NEGATIVE mg/dL
Ketones, POC UA: NEGATIVE mg/dL
Leukocytes, UA: NEGATIVE
Nitrite, UA: NEGATIVE
Protein Ur, POC: NEGATIVE mg/dL
Spec Grav, UA: >=1.03 — AB (ref 1.010–1.025)
Urobilinogen, UA: 0.2 E.U./dL
pH, UA: 5.5 (ref 5.0–8.0)

## 2020-10-30 LAB — POC SARS CORONAVIRUS 2 AG -  ED: SARS Coronavirus 2 Ag: NEGATIVE

## 2020-10-30 MED ORDER — ONDANSETRON 4 MG PO TBDP
4.0000 mg | ORAL_TABLET | Freq: Once | ORAL | Status: AC
  Administered 2020-10-30: 4 mg via ORAL

## 2020-10-30 MED ORDER — ACETAMINOPHEN 325 MG PO TABS
650.0000 mg | ORAL_TABLET | Freq: Once | ORAL | Status: AC
  Administered 2020-10-30: 650 mg via ORAL

## 2020-10-30 MED ORDER — ONDANSETRON 4 MG PO TBDP: 4.0000 mg | ORAL_TABLET | Freq: Three times a day (TID) | ORAL | 0 refills | Status: DC | PRN

## 2020-10-30 MED ORDER — MAGNESIUM CITRATE PO SOLN: ORAL | 0 refills | Status: DC

## 2020-10-30 NOTE — ED Triage Notes (Signed)
Woke up during the night w/ a fever and RLQ pain  Body aches  Emesis x 2  No COVID vaccine  No one else sick at home  Denies sore throat

## 2020-10-30 NOTE — Discharge Instructions (Addendum)
Advised/encouraged patient to take medication as directed to completion.  Advised patient may use Zofran daily, as needed for nausea.  Advised patient may take Tylenol 1000 mg 1-3 times daily, as needed for fever.  Advised/encouraged patient to increase daily water intake (36 to 48 ounces minimal) and daily fiber intake to 40 mg/day.  Advised we will follow-up with COVID-19 flu A&B results once received.

## 2020-10-30 NOTE — ED Provider Notes (Signed)
[name]    CSN: [id number] Arrival date & time: 10/30/20  1038      History   Chief Complaint Chief Complaint  Patient presents with  . Fever  . Nausea  . Abdominal Pain    HPI Shelley Smith is a 45 y.o. female.   HPI 45 year old female presents with fever, vomiting, body aches, and right lower quadrant pain x 2 days.  CT of abdomen and pelvis performed on 07/28/2020 revealed markedly enlarged uterus with multiple uterine fibroids, fat-containing periumbilical hernia, and hepatic steatosis.  Additionally, patient reports her GYN has scheduled for her for MRI of abdomen with contrast for Monday, 11/02/2020 for further evaluation of current uterine fibroids.  Past Medical History:  Diagnosis Date  . Diabetes mellitus without complication ([place])     There are no problems to display for this patient.   Past Surgical History:  Procedure Laterality Date  . CESAREAN SECTION    . CHOLECYSTECTOMY    . IR RADIOLOGIST EVAL & MGMT  10/22/2020  . TONSILLECTOMY    . TUBAL LIGATION      OB History    Gravida  4   Para  3   Term  3   Preterm      AB  1   Living  1     SAB  1   IAB      Ectopic      Multiple      Live Births               Home Medications    Prior to Admission medications   Medication Sig Start Date End Date Taking? Authorizing Provider  magnesium citrate SOLN Take 150 mL p.o. twice daily for the next 3 days. 10/30/20  Yes Eliezer Lofts, FNP  ondansetron (ZOFRAN-ODT) 4 MG disintegrating tablet Take 1 tablet (4 mg total) by mouth every 8 (eight) hours as needed for nausea or vomiting. 10/30/20  Yes Eliezer Lofts, FNP  diazepam (VALIUM) 5 MG tablet Take 1 tablet (5 mg total) by mouth as needed (prior to MRI). Pt to take 5 mg of Valium 1 hour prior to MRI on 11/02/20. May take an additional 5 mg of Valium 30 minutes later (30 minutes prior to MRI) if needed. 11/02/20   Greggory Keen, MD  ferrous sulfate 324 MG TBEC Take 324 mg by  mouth.    [provider]    Family History Family History  Problem Relation Age of Onset  . Cancer Maternal Uncle     Social History Social History   Tobacco Use  . Smoking status: Never Smoker  . Smokeless tobacco: Never Used  Vaping Use  . Vaping Use: Never used  Substance Use Topics  . Alcohol use: Not Currently  . Drug use: Never     Allergies   Codeine   Review of Systems Review of Systems  Constitutional: Positive for fever.  HENT: Negative.   Eyes: Negative.   Respiratory: Negative.   Cardiovascular: Negative.   Gastrointestinal: Positive for abdominal pain and nausea.  Genitourinary: Negative.   Musculoskeletal: Positive for myalgias.  Skin: Negative.   Neurological: Negative.      Physical Exam Triage Vital Signs ED Triage Vitals  Enc Vitals Group     BP 10/30/20 1205 115/76     Pulse Rate 10/30/20 1205 92     Resp 10/30/20 1205 16     Temp 10/30/20 1205 (!) 103.1 F (39.5 C)     Temp  Source 10/30/20 1205 Oral     SpO2 10/30/20 1205 97 %     Weight 10/30/20 1206 172 lb (78 kg)     Height 10/30/20 1206 5\' 1"  (1.549 m)     Head Circumference --      Peak Flow --      Pain Score 10/30/20 1205 6     Pain Loc --      Pain Edu? --      Excl. in [place]? --    No data found.  Updated Vital Signs BP 115/76 (BP Location: Right Arm)   Pulse 92   Temp (!) 103.1 F (39.5 C) (Oral)   Resp 16   Ht 5\' 1"  (1.549 m)   Wt 172 lb (78 kg)   LMP 10/25/2020 Comment: BTL  SpO2 97%   BMI 32.50 kg/m      Physical Exam Vitals and nursing note reviewed.  Constitutional:      General: She is not in acute distress.    Appearance: She is well-developed. She is obese. She is ill-appearing.  HENT:     Head: Normocephalic and atraumatic.     Mouth/Throat:     Mouth: Mucous membranes are moist.     Pharynx: Oropharynx is clear.  Eyes:     Extraocular Movements: Extraocular movements intact.     Pupils: Pupils are equal, round, and reactive to  light.  Cardiovascular:     Rate and Rhythm: Normal rate and regular rhythm.     Heart sounds: Normal heart sounds. No murmur heard.   Pulmonary:     Effort: Pulmonary effort is normal. No respiratory distress.     Breath sounds: Normal breath sounds. No wheezing, rhonchi or rales.  Abdominal:     General: Bowel sounds are absent. There is distension.     Palpations: Abdomen is soft. There is no shifting dullness, fluid wave, hepatomegaly, splenomegaly, mass or pulsatile mass.     Tenderness: There is abdominal tenderness in the right lower quadrant, periumbilical area and left lower quadrant. There is no right CVA tenderness, left CVA tenderness, guarding or rebound. Negative signs include Murphy's sign, McBurney's sign and psoas sign.  Skin:    General: Skin is warm and dry.  Neurological:     General: No focal deficit present.     Mental Status: She is alert and oriented to person, place, and time.  Psychiatric:        Mood and Affect: Mood normal.        Behavior: Behavior normal.      UC Treatments / Results  Labs (all labs ordered are listed, but only abnormal results are displayed) Labs Reviewed  POCT URINALYSIS DIP (MANUAL ENTRY) - Abnormal; Notable for the following components:      Result Value   Spec Grav, UA >=1.030 (*)    All other components within normal limits  COVID-19, FLU A+B NAA  POC SARS CORONAVIRUS 2 AG -  ED    EKG   Radiology No results found.  Procedures Procedures (including critical care time)  Medications Ordered in UC Medications  acetaminophen (TYLENOL) tablet 650 mg (650 mg Oral Given 10/30/20 1214)  ondansetron (ZOFRAN-ODT) disintegrating tablet 4 mg (4 mg Oral Given 10/30/20 1212)    Initial Impression / Assessment and Plan / UC Course  I have reviewed the triage vital signs and the nursing notes.  Pertinent labs & imaging results that were available during my care of the patient were reviewed by me  and considered in my medical  decision making (see chart for details).     MDM: 1.  Fever, 2. Lower abdominal pain, 3. Constipation.  Patient discharged home, hemodynamically stable.  COVID-19/flu A&B ordered. Final Clinical Impressions(s) / UC Diagnoses   Final diagnoses:  Lower abdominal pain  Fever, unspecified  Body aches  Other constipation     Discharge Instructions     Advised/encouraged patient to take medication as directed to completion.  Advised patient may use Zofran daily, as needed for nausea.  Advised patient may take Tylenol 1000 mg 1-3 times daily, as needed for fever.  Advised/encouraged patient to increase daily water intake (36 to 48 ounces minimal) and daily fiber intake to 40 mg/day.  Advised we will follow-up with COVID-19 flu A&B results once received.    ED Prescriptions    Medication Sig Dispense Auth. Provider   magnesium citrate SOLN Take 150 mL p.o. twice daily for the next 3 days. 900 mL Eliezer Lofts, FNP   ondansetron (ZOFRAN-ODT) 4 MG disintegrating tablet Take 1 tablet (4 mg total) by mouth every 8 (eight) hours as needed for nausea or vomiting. 20 tablet Eliezer Lofts, FNP     PDMP not reviewed this encounter.   Eliezer Lofts, [place] 10/30/20 1332

## 2020-11-01 LAB — COVID-19, FLU A+B NAA
Influenza A, NAA: NOT DETECTED
Influenza B, NAA: NOT DETECTED
SARS-CoV-2, NAA: NOT DETECTED

## 2020-11-02 ENCOUNTER — Encounter (HOSPITAL_COMMUNITY): Payer: Self-pay

## 2020-11-02 ENCOUNTER — Ambulatory Visit (HOSPITAL_COMMUNITY): Admission: RE | Admit: 2020-11-02 | Payer: Medicaid Other | Source: Ambulatory Visit

## 2020-11-04 ENCOUNTER — Other Ambulatory Visit: Payer: Self-pay | Admitting: Student

## 2020-11-04 DIAGNOSIS — D259 Leiomyoma of uterus, unspecified: Secondary | ICD-10-CM

## 2020-11-04 MED ORDER — DIAZEPAM 5 MG PO TABS: 5.0000 mg | ORAL_TABLET | ORAL | 0 refills | Status: DC | PRN

## 2020-11-07 ENCOUNTER — Other Ambulatory Visit: Payer: Self-pay

## 2020-11-07 ENCOUNTER — Other Ambulatory Visit (HOSPITAL_COMMUNITY): Payer: Self-pay | Admitting: *Deleted

## 2020-11-07 ENCOUNTER — Ambulatory Visit (HOSPITAL_COMMUNITY)
Admission: RE | Admit: 2020-11-07 | Discharge: 2020-11-07 | Disposition: A | Discharge status: Home / Self Care | Payer: Medicaid Other | Source: Ambulatory Visit | Attending: Interventional Radiology | Admitting: Interventional Radiology

## 2020-11-07 DIAGNOSIS — D259 Leiomyoma of uterus, unspecified: Secondary | ICD-10-CM | POA: Insufficient documentation

## 2020-11-07 DIAGNOSIS — E119 Type 2 diabetes mellitus without complications: Secondary | ICD-10-CM | POA: Diagnosis not present

## 2020-11-07 MED ORDER — GADOBUTROL 1 MMOL/ML IV SOLN
7.0000 mL | Freq: Once | INTRAVENOUS | Status: AC | PRN
  Administered 2020-11-07: 7 mL via INTRAVENOUS

## 2020-11-17 ENCOUNTER — Other Ambulatory Visit (HOSPITAL_COMMUNITY): Payer: Self-pay | Admitting: Interventional Radiology

## 2020-11-17 DIAGNOSIS — D259 Leiomyoma of uterus, unspecified: Secondary | ICD-10-CM

## 2020-11-24 ENCOUNTER — Other Ambulatory Visit (HOSPITAL_COMMUNITY)
Admission: RE | Admit: 2020-11-24 | Discharge: 2020-11-24 | Disposition: A | Discharge status: Home / Self Care | Payer: Medicaid Other | Source: Ambulatory Visit | Attending: Interventional Radiology | Admitting: Interventional Radiology

## 2020-11-24 DIAGNOSIS — Z01812 Encounter for preprocedural laboratory examination: Secondary | ICD-10-CM | POA: Insufficient documentation

## 2020-11-24 DIAGNOSIS — Z20822 Contact with and (suspected) exposure to covid-19: Secondary | ICD-10-CM | POA: Diagnosis not present

## 2020-11-24 LAB — SARS CORONAVIRUS 2 (TAT 6-24 HRS): SARS Coronavirus 2: NEGATIVE

## 2020-11-26 ENCOUNTER — Observation Stay (HOSPITAL_COMMUNITY)
Admission: RE | Admit: 2020-11-26 | Discharge: 2020-11-27 | Disposition: A | Discharge status: Home / Self Care | Payer: Medicaid Other | Source: Ambulatory Visit | Attending: Interventional Radiology | Admitting: Interventional Radiology

## 2020-11-26 ENCOUNTER — Other Ambulatory Visit: Payer: Self-pay

## 2020-11-26 ENCOUNTER — Encounter (HOSPITAL_COMMUNITY): Payer: Self-pay

## 2020-11-26 ENCOUNTER — Ambulatory Visit (HOSPITAL_COMMUNITY)
Admission: RE | Admit: 2020-11-26 | Discharge: 2020-11-26 | Disposition: A | Discharge status: Home / Self Care | Payer: Medicaid Other | Source: Ambulatory Visit | Attending: Interventional Radiology | Admitting: Interventional Radiology

## 2020-11-26 DIAGNOSIS — Z880 Allergy status to penicillin: Secondary | ICD-10-CM | POA: Diagnosis not present

## 2020-11-26 DIAGNOSIS — Z809 Family history of malignant neoplasm, unspecified: Secondary | ICD-10-CM | POA: Diagnosis not present

## 2020-11-26 DIAGNOSIS — D259 Leiomyoma of uterus, unspecified: Secondary | ICD-10-CM | POA: Diagnosis present

## 2020-11-26 DIAGNOSIS — Z9049 Acquired absence of other specified parts of digestive tract: Secondary | ICD-10-CM | POA: Diagnosis not present

## 2020-11-26 DIAGNOSIS — Z885 Allergy status to narcotic agent status: Secondary | ICD-10-CM | POA: Diagnosis not present

## 2020-11-26 DIAGNOSIS — D219 Benign neoplasm of connective and other soft tissue, unspecified: Secondary | ICD-10-CM | POA: Diagnosis present

## 2020-11-26 HISTORY — PX: IR US GUIDE VASC ACCESS RIGHT: IMG2390

## 2020-11-26 HISTORY — PX: IR US GUIDE VASC ACCESS LEFT: IMG2389

## 2020-11-26 HISTORY — PX: IR ANGIOGRAM PELVIS SELECTIVE OR SUPRASELECTIVE: IMG661

## 2020-11-26 HISTORY — PX: IR EMBO TUMOR ORGAN ISCHEMIA INFARCT INC GUIDE ROADMAPPING: IMG5449

## 2020-11-26 HISTORY — PX: IR ANGIOGRAM SELECTIVE EACH ADDITIONAL VESSEL: IMG667

## 2020-11-26 LAB — CBC
HCT: 37.5 % (ref 36.0–46.0)
Hemoglobin: 11.5 g/dL — ABNORMAL LOW (ref 12.0–15.0)
MCH: 25.4 pg — ABNORMAL LOW (ref 26.0–34.0)
MCHC: 30.7 g/dL (ref 30.0–36.0)
MCV: 83 fL (ref 80.0–100.0)
Platelets: 407 10*3/uL — ABNORMAL HIGH (ref 150–400)
RBC: 4.52 MIL/uL (ref 3.87–5.11)
RDW: 16.4 % — ABNORMAL HIGH (ref 11.5–15.5)
WBC: 8.8 10*3/uL (ref 4.0–10.5)
nRBC: 0 % (ref 0.0–0.2)

## 2020-11-26 LAB — BASIC METABOLIC PANEL
Anion gap: 8 (ref 5–15)
BUN: 11 mg/dL (ref 6–20)
CO2: 25 mmol/L (ref 22–32)
Calcium: 9.4 mg/dL (ref 8.9–10.3)
Chloride: 103 mmol/L (ref 98–111)
Creatinine, Ser: 0.8 mg/dL (ref 0.44–1.00)
GFR, Estimated: >60 mL/min (ref >60)
Glucose, Bld: 119 mg/dL — ABNORMAL HIGH (ref 70–99)
Potassium: 4 mmol/L (ref 3.5–5.1)
Sodium: 136 mmol/L (ref 135–145)

## 2020-11-26 LAB — APTT: aPTT: 27 seconds (ref 24–36)

## 2020-11-26 LAB — PROTIME-INR
INR: 1 (ref 0.8–1.2)
Prothrombin Time: 12.9 seconds (ref 11.4–15.2)

## 2020-11-26 LAB — TYPE AND SCREEN
ABO/RH(D): B POS
Antibody Screen: NEGATIVE

## 2020-11-26 LAB — ABO/RH: ABO/RH(D): B POS

## 2020-11-26 LAB — HCG, SERUM, QUALITATIVE: Preg, Serum: NEGATIVE

## 2020-11-26 MED ORDER — LIDOCAINE HCL (PF) 1 % IJ SOLN
0.2000 mL | Freq: Once | INTRAMUSCULAR | Status: DC
  Filled 2020-11-26: qty 2

## 2020-11-26 MED ORDER — SODIUM CHLORIDE 0.9 % IV SOLN: 250.0000 mL | INTRAVENOUS | Status: DC | PRN

## 2020-11-26 MED ORDER — NALOXONE HCL 0.4 MG/ML IJ SOLN: 0.4000 mg | INTRAMUSCULAR | Status: DC | PRN

## 2020-11-26 MED ORDER — ONDANSETRON HCL 4 MG/2ML IJ SOLN
4.0000 mg | Freq: Four times a day (QID) | INTRAMUSCULAR | Status: DC | PRN
  Filled 2020-11-26: qty 2

## 2020-11-26 MED ORDER — DEXAMETHASONE 4 MG PO TABS
8.0000 mg | ORAL_TABLET | ORAL | Status: AC
  Administered 2020-11-26: 8 mg via ORAL
  Filled 2020-11-26: qty 2

## 2020-11-26 MED ORDER — FENTANYL CITRATE (PF) 100 MCG/2ML IJ SOLN
INTRAMUSCULAR | Status: AC | PRN
  Administered 2020-11-26 (×4): 50 ug via INTRAVENOUS

## 2020-11-26 MED ORDER — SODIUM CHLORIDE 0.9 % IV SOLN: INTRAVENOUS | Status: DC

## 2020-11-26 MED ORDER — VANCOMYCIN HCL IN DEXTROSE 1-5 GM/200ML-% IV SOLN
INTRAVENOUS | Status: AC
  Administered 2020-11-26: 1000 mg via INTRAVENOUS
  Filled 2020-11-26: qty 200

## 2020-11-26 MED ORDER — PROMETHAZINE HCL 25 MG RE SUPP
25.0000 mg | Freq: Three times a day (TID) | RECTAL | Status: DC | PRN
  Filled 2020-11-26: qty 1

## 2020-11-26 MED ORDER — DIPHENHYDRAMINE HCL 12.5 MG/5ML PO ELIX
12.5000 mg | ORAL_SOLUTION | Freq: Four times a day (QID) | ORAL | Status: DC | PRN
Start: 2020-11-26 — End: 2020-11-27
  Filled 2020-11-26: qty 5

## 2020-11-26 MED ORDER — PROMETHAZINE HCL 25 MG PO TABS: 25.0000 mg | ORAL_TABLET | Freq: Three times a day (TID) | ORAL | Status: DC | PRN

## 2020-11-26 MED ORDER — IOHEXOL 300 MG/ML  SOLN
50.0000 mL | Freq: Once | INTRAMUSCULAR | Status: AC | PRN
  Administered 2020-11-26: 20 mL via INTRA_ARTERIAL

## 2020-11-26 MED ORDER — KETOROLAC TROMETHAMINE 30 MG/ML IJ SOLN
INTRAMUSCULAR | Status: AC
  Administered 2020-11-26: 30 mg via INTRAVENOUS
  Filled 2020-11-26: qty 1

## 2020-11-26 MED ORDER — LIDOCAINE HCL 1 % IJ SOLN
INTRAMUSCULAR | Status: AC
  Filled 2020-11-26: qty 20

## 2020-11-26 MED ORDER — SODIUM CHLORIDE 0.9% FLUSH
3.0000 mL | Freq: Two times a day (BID) | INTRAVENOUS | Status: DC
  Administered 2020-11-27 (×2): 3 mL via INTRAVENOUS

## 2020-11-26 MED ORDER — VANCOMYCIN HCL IN DEXTROSE 1-5 GM/200ML-% IV SOLN: 1000.0000 mg | Freq: Once | INTRAVENOUS | Status: AC

## 2020-11-26 MED ORDER — HYDROMORPHONE 1 MG/ML IV SOLN
INTRAVENOUS | Status: AC
  Administered 2020-11-26: 30 mg via INTRAVENOUS
  Administered 2020-11-26: 1.2 mg via INTRAVENOUS
  Administered 2020-11-26: 4.2 mg via INTRAVENOUS
  Administered 2020-11-27: 2.4 mg via INTRAVENOUS
  Filled 2020-11-26 (×11): qty 30

## 2020-11-26 MED ORDER — DOCUSATE SODIUM 100 MG PO CAPS
100.0000 mg | ORAL_CAPSULE | Freq: Two times a day (BID) | ORAL | Status: DC
  Administered 2020-11-26 – 2020-11-27 (×2): 100 mg via ORAL
  Filled 2020-11-26 (×2): qty 1

## 2020-11-26 MED ORDER — MIDAZOLAM HCL 2 MG/2ML IJ SOLN
1.0000 mg | Freq: Once | INTRAMUSCULAR | Status: AC
  Administered 2020-11-26: 1 mg via INTRAVENOUS
  Filled 2020-11-26: qty 2

## 2020-11-26 MED ORDER — SODIUM CHLORIDE 0.9% FLUSH: 9.0000 mL | INTRAVENOUS | Status: DC | PRN

## 2020-11-26 MED ORDER — ONDANSETRON HCL 4 MG/2ML IJ SOLN
INTRAMUSCULAR | Status: AC
  Administered 2020-11-26: 4 mg via INTRAVENOUS
  Filled 2020-11-26: qty 2

## 2020-11-26 MED ORDER — FENTANYL CITRATE (PF) 100 MCG/2ML IJ SOLN
INTRAMUSCULAR | Status: AC
  Filled 2020-11-26: qty 4

## 2020-11-26 MED ORDER — ONDANSETRON HCL 4 MG/2ML IJ SOLN
4.0000 mg | Freq: Four times a day (QID) | INTRAMUSCULAR | Status: DC | PRN
  Administered 2020-11-26 – 2020-11-27 (×3): 4 mg via INTRAVENOUS
  Filled 2020-11-26 (×2): qty 2

## 2020-11-26 MED ORDER — MIDAZOLAM HCL 2 MG/2ML IJ SOLN
INTRAMUSCULAR | Status: AC
  Filled 2020-11-26: qty 6

## 2020-11-26 MED ORDER — DIPHENHYDRAMINE HCL 50 MG/ML IJ SOLN: 12.5000 mg | Freq: Four times a day (QID) | INTRAMUSCULAR | Status: DC | PRN

## 2020-11-26 MED ORDER — MIDAZOLAM HCL 2 MG/2ML IJ SOLN
INTRAMUSCULAR | Status: AC | PRN
  Administered 2020-11-26: 2 mg via INTRAVENOUS
  Administered 2020-11-26: 1 mg via INTRAVENOUS
  Administered 2020-11-26: 2 mg via INTRAVENOUS
  Administered 2020-11-26: 1 mg via INTRAVENOUS

## 2020-11-26 MED ORDER — CEFAZOLIN SODIUM-DEXTROSE 2-4 GM/100ML-% IV SOLN: 2.0000 g | Freq: Once | INTRAVENOUS | Status: DC

## 2020-11-26 MED ORDER — LIDOCAINE-EPINEPHRINE 1 %-1:100000 IJ SOLN
INTRAMUSCULAR | Status: AC | PRN
  Administered 2020-11-26: 5 mL

## 2020-11-26 MED ORDER — KETOROLAC TROMETHAMINE 30 MG/ML IJ SOLN: 30.0000 mg | INTRAMUSCULAR | Status: AC

## 2020-11-26 MED ORDER — SODIUM CHLORIDE 0.9% FLUSH: 3.0000 mL | INTRAVENOUS | Status: DC | PRN

## 2020-11-26 MED ORDER — KETOROLAC TROMETHAMINE 30 MG/ML IJ SOLN
30.0000 mg | Freq: Four times a day (QID) | INTRAMUSCULAR | Status: DC
  Administered 2020-11-26 – 2020-11-27 (×3): 30 mg via INTRAVENOUS
  Filled 2020-11-26 (×3): qty 1

## 2020-11-26 MED ORDER — IOHEXOL 300 MG/ML  SOLN
100.0000 mL | Freq: Once | INTRAMUSCULAR | Status: AC | PRN
  Administered 2020-11-26: 100 mL via INTRA_ARTERIAL

## 2020-11-26 NOTE — H&P (Signed)
Chief Complaint: Patient was seen in consultation today for fibroid embolization at the request of Shelley Smith  Referring Physician(s): Shelley Smith  Supervising Physician: Shelley Smith  Patient Status: Shelley Smith  History of Present Illness: Shelley Smith is a 45 y.o. female with symptomatic uterine fibroids. She was seen in consult by Dr. Annamaria Smith on 10/22/20. Please see consult note for details of that discussion. She has since had pelvic MRI showing pronounced fibroid uterus and is a candidate to proceed with fibroid/uterine artery embolization and is scheduled for today. PMHx, meds, labs, imaging, allergies reviewed. Feels well, no recent fevers, chills, illness. Has been NPO today as directed.    Past Medical History:  Diagnosis Date   Diabetes mellitus without complication [facility])     Past Surgical History:  Procedure Laterality Date   CESAREAN SECTION     CHOLECYSTECTOMY     IR RADIOLOGIST EVAL & MGMT  10/22/2020   TONSILLECTOMY     TUBAL LIGATION      Allergies: Penicillins and Codeine  Medications: Prior to Admission medications   Medication Sig Start Date End Date Taking? Authorizing Provider  diazepam (VALIUM) 5 MG tablet Take 1 tablet (5 mg total) by mouth as needed for up to 2 doses (prior to MRI). Pt to take 5 mg of Valium 1 hour prior to MRI on 11/02/20. May take an additional 5 mg of Valium 30 minutes later (30 minutes prior to MRI) if needed. 11/04/20  Yes Docia Barrier, PA  ferrous sulfate 324 MG TBEC Take 324 mg by mouth.   Yes [provider]  magnesium citrate SOLN Take 150 mL p.o. twice daily for the next 3 days. 10/30/20  Yes Eliezer Lofts, FNP  ondansetron (ZOFRAN-ODT) 4 MG disintegrating tablet Take 1 tablet (4 mg total) by mouth every 8 (eight) hours as needed for nausea or vomiting. 10/30/20  Yes Eliezer Lofts, FNP     Family History  Problem Relation Age of Onset   Cancer Maternal Uncle     Social History    Socioeconomic History   Marital status: Significant Other    Spouse name: Not on file   Number of children: Not on file   Years of education: Not on file   Highest education level: Not on file  Occupational History   Not on file  Tobacco Use   Smoking status: Never   Smokeless tobacco: Never  Vaping Use   Vaping Use: Never used  Substance and Sexual Activity   Alcohol use: Not Currently   Drug use: Never   Sexual activity: Yes    Birth control/protection: Surgical  Other Topics Concern   Not on file  Social History Narrative   Not on file   Social Determinants of Health   Financial Resource Strain: Not on file  Food Insecurity: Not on file  Transportation Needs: Not on file  Physical Activity: Not on file  Stress: Not on file  Social Connections: Not on file     Review of Systems: A 12 point ROS discussed and pertinent positives are indicated in the HPI above.  All other systems are negative.  Review of Systems  Vital Signs: BP 102/65   Pulse 71   Temp 97.8 F (36.6 C) (Oral)   Resp 18   Ht 5' 1.5" (1.562 m)   Wt 77.1 kg   SpO2 100%   BMI 31.60 kg/m   Physical Exam Constitutional:      Appearance: Normal appearance. She is not ill-appearing.  HENT:     Mouth/Throat:     Mouth: Mucous membranes are moist.     Pharynx: Oropharynx is clear.  Cardiovascular:     Rate and Rhythm: Normal rate and regular rhythm.     Pulses: Normal pulses.     Heart sounds: Normal heart sounds.  Pulmonary:     Effort: Pulmonary effort is normal. No respiratory distress.     Breath sounds: Normal breath sounds.  Abdominal:     General: Abdomen is flat. There is no distension.     Palpations: Abdomen is soft.     Tenderness: There is no abdominal tenderness.  Skin:    General: Skin is warm and dry.  Neurological:     General: No focal deficit present.     Mental Status: She is alert and oriented to person, place, and time.  Psychiatric:        Mood and Affect: Mood  normal.        Thought Content: Thought content normal.        Judgment: Judgment normal.     Imaging: MR PELVIS W WO CONTRAST  Result Date: 11/09/2020 CLINICAL DATA:  Uterine fibroids EXAM: MRI PELVIS WITHOUT AND WITH CONTRAST TECHNIQUE: Multiplanar multisequence MR imaging of the pelvis was performed both before and after administration of intravenous contrast. CONTRAST:  37mL GADAVIST GADOBUTROL 1 MMOL/ML IV SOLN COMPARISON:  Pelvic ultrasound dated 08/21/2020 FINDINGS: Urinary Tract:  Bladder is within normal limits. Kidneys are unremarkable.  No hydronephrosis. Bowel:  Visualized bowel is unremarkable. Vascular/Lymphatic: No evidence of aneurysm. No suspicious pelvic lymphadenopathy. Reproductive: Uterus measures 10.9 x 13.9 x 13.6 cm. Endometrium is distorted/displaced by multiple uterine fibroids, measuring 4 mm (series 7/image 16). Numerous uterine fibroids, approximately 25-30 in number, including: --5.5 cm subserosal fibroid in the right posterior uterine fundus (series 6/image 10) --5.4 cm subserosal fibroid in the right lateral uterine body (series 6/image 16) --6.4 cm intramural fibroid in the left uterine body (series 6/image 21) All fibroids enhance following contrast administration. Left ovary measures 2.1 x 1.5 cm (series 6/image 22), within normal limits. Right ovary measures 1.8 x 1.1 cm (series 6/image 15), within normal limits. Other:  No pelvic ascites. Musculoskeletal: No focal osseous lesions. IMPRESSION: Numerous uterine fibroids, including index lesions as above. Electronically Signed   By: Julian Hy M.D.   On: 11/09/2020 09:14    Labs:  CBC: Recent Labs    07/28/20 1556 11/26/20 1211  WBC 9.1 8.8  HGB 9.6* 11.5*  HCT 30.3* 37.5  PLT 433* 407*    COAGS: No results for input(s): INR, APTT in the last 8760 hours.  BMP: Recent Labs    07/28/20 1543 07/28/20 1556  NA  --  134*  K  --  4.1  CL  --  99  CO2  --  23  GLUCOSE  --  87  BUN  --  11   CALCIUM  --  8.8  CREATININE 0.80 0.85  GFRNONAA  --  82  GFRAA  --  95    LIVER FUNCTION TESTS: Recent Labs    07/28/20 1556  BILITOT 0.5  AST 13  ALT 16  PROT 6.8    TUMOR MARKERS: No results for input(s): AFPTM, CEA, CA199, CHROMGRNA in the last 8760 hours.  Assessment and Plan: Symptomatic uterine fibroids Plan for uterine artery embo Labs pending Risks and benefits discussed with the patient including, but not limited to bleeding, infection, vascular injury, post procedural pain, nausea, vomiting and  fatigue, contrast induced renal failure, and possible need for additional procedures. All of the patient's questions were answered, patient is agreeable to proceed. Consent signed and in chart. Anticipate admission for overnight observation/perioperative pain control  Thank you for this interesting consult.  I greatly enjoyed meeting Shelley Smith and look forward to participating in their care.  A copy of this report was sent to the requesting provider on this date.  Electronically Signed: Ascencion Dike, PA-C 11/26/2020, 1:03 PM   I spent a total of 20 minutes in face to face in clinical consultation, greater than 50% of which was counseling/coordinating care for

## 2020-11-26 NOTE — Procedures (Signed)
Interventional Radiology Procedure Note  Procedure: UFE    Complications: None  Estimated Blood Loss:  MIN  Findings: SUCCESSFUL BILATERAL EMBO FULL REPORT IN PACS     M. TREVOR Carry Ortez, MD    

## 2020-11-26 NOTE — Sedation Documentation (Signed)
monojet of dilauid pca broken in tube station wasted by D Dona Klemann RN and S Catu Rn

## 2020-11-27 ENCOUNTER — Other Ambulatory Visit (HOSPITAL_COMMUNITY): Payer: Self-pay | Admitting: Student

## 2020-11-27 DIAGNOSIS — D259 Leiomyoma of uterus, unspecified: Secondary | ICD-10-CM | POA: Diagnosis not present

## 2020-11-27 MED ORDER — TRAMADOL HCL 50 MG PO TABS: 50.0000 mg | ORAL_TABLET | Freq: Four times a day (QID) | ORAL | 0 refills | Status: DC | PRN

## 2020-11-27 MED ORDER — IBUPROFEN 600 MG PO TABS: 600.0000 mg | ORAL_TABLET | Freq: Four times a day (QID) | ORAL | 0 refills | Status: AC

## 2020-11-27 MED ORDER — IBUPROFEN 400 MG PO TABS
600.0000 mg | ORAL_TABLET | Freq: Four times a day (QID) | ORAL | Status: DC
  Administered 2020-11-27 (×2): 600 mg via ORAL
  Filled 2020-11-27 (×2): qty 1

## 2020-11-27 MED ORDER — DOCUSATE SODIUM 100 MG PO CAPS: 100.0000 mg | ORAL_CAPSULE | Freq: Two times a day (BID) | ORAL | 0 refills | Status: DC

## 2020-11-27 MED ORDER — HYDROCODONE-ACETAMINOPHEN 5-325 MG PO TABS
1.0000 | ORAL_TABLET | Freq: Four times a day (QID) | ORAL | Status: DC | PRN
  Filled 2020-11-27: qty 1

## 2020-11-27 MED ORDER — ONDANSETRON HCL 8 MG PO TABS: 8.0000 mg | ORAL_TABLET | Freq: Three times a day (TID) | ORAL | 0 refills | Status: AC | PRN

## 2020-11-27 MED ORDER — TRAMADOL HCL 50 MG PO TABS
50.0000 mg | ORAL_TABLET | Freq: Once | ORAL | Status: AC
  Administered 2020-11-27: 50 mg via ORAL
  Filled 2020-11-27: qty 1

## 2020-11-27 NOTE — Progress Notes (Signed)
Witness Orson Gear RN waste 23ml of Dilaudid in steri-cycle

## 2020-11-27 NOTE — Discharge Summary (Addendum)
Patient ID: Shelley Smith MRN: [id number] DOB/AGE: [birth year] 45 y.o.  Admit date: 11/26/2020 Discharge date: 11/27/2020  Supervising Physician: Jacqulynn Cadet  Patient Status: [facility] - In-pt  Admission Diagnoses: symptomatic uterine fibroids  Discharge Diagnoses:  Active Problems:   Fibroids   Discharged Condition: good  Hospital Course:   Patient presented to [facility] IR for an image-guided bilateral uterine artery embolization with Dr. Annamaria Boots on 11/26/20.  Procedure occurred without major complications and patient was transferred to floor in stable condition (VSS, bilateral groin puncture sites stable) for overnight observation. No major events occurred overnight.   Patient awake and alert  no complaints at this time. Foley catheter removed without difficulty. Patient denies severe abdominal pain. Bilateral groin puncture sites stable.   RN reported that patient's O2 sat dropped to 91% this morning. Patient was placed on 2L nasal cannula and sat elevated to 100%.  O2 was discontinued around 11 am O2 sat remained above 95%.  Plan to discharge home today and follow-up with Dr. Annamaria Boots for televisit 3-4 weeks after discharge.   Consults: None  Significant Diagnostic Studies: none   Treatments: bilateral uterine artery embolization   Discharge Exam: Blood pressure 127/73, pulse (!) 55, temperature 98.6 F (37 C), temperature source Oral, resp. rate 18, height 5' 1.5" (1.562 m), weight 77.1 kg, SpO2 99 %. Physical Exam  Vitals and nursing note reviewed.  Constitutional:      General: He is not in acute distress.    Appearance: Normal appearance.  HENT:    Head: Normocephalic and atraumatic.     Mouth/Throat:     Mouth: Mucous membranes are moist.     Pharynx: Oropharynx is clear.  Cardiovascular:    Rate and Rhythm: Normal rate and regular rhythm.     Pulses: Normal pulses.     Heart sounds: Normal heart sounds.     DP 2+ bilaterally Pulmonary:    Effort: Pulmonary  effort is normal.     Breath sounds: Normal breath sounds. No wheezing, rhonchi or rales.  Abdominal:     General: Bowel sounds are normal. There is no distension.     Palpations: Abdomen is soft. Skin:    General: Skin is warm and dry.  Positive dressing on bilateral femoral artery puncture sites. Site are unremarkable with no erythema, edema, tenderness, bleeding or drainage. Minimal amount of old, dry blood note son the dressing. Dressing otherwise clean, dry, and intact.  Neurological:    Mental Status: He is alert and oriented to person, place, and time.  Psychiatric:        Mood and Affect: Mood normal.        Behavior: Behavior normal.     Disposition: Discharge disposition: 01-Home or Self Care      Discharge Instructions     Call MD for:   Complete by: As directed    Call MD for:  difficulty breathing, headache or visual disturbances   Complete by: As directed    Call MD for:  extreme fatigue   Complete by: As directed    Call MD for:  hives   Complete by: As directed    Call MD for:  persistant dizziness or light-headedness   Complete by: As directed    Call MD for:  persistant nausea and vomiting   Complete by: As directed    Call MD for:  redness, tenderness, or signs of infection (pain, swelling, redness, odor or green/yellow discharge around incision site)   Complete by: As  directed    Call MD for:  severe uncontrolled pain   Complete by: As directed    Call MD for:  temperature >100.4   Complete by: As directed    Diet - low sodium heart healthy   Complete by: As directed    Discharge wound care:   Complete by: As directed    Keep the site clean and dry. No submerging (bathing or swimming) for 7 days.   Increase activity slowly   Complete by: As directed       Allergies as of 11/27/2020       Reactions   Codeine Anaphylaxis   Throat swells   Other Nausea And Vomiting   Lobster and crab causes nausea and vomiting   Penicillins Anaphylaxis         Medication List     STOP taking these medications    ondansetron 4 MG disintegrating tablet Commonly known as: ZOFRAN-ODT       TAKE these medications    acetaminophen 500 MG tablet Commonly known as: TYLENOL Take 1,000 mg by mouth every 6 (six) hours as needed for moderate pain.   docusate sodium 100 MG capsule Commonly known as: COLACE Take 1 capsule (100 mg total) by mouth 2 (two) times daily.   ferrous sulfate 324 MG Tbec Take 324 mg by mouth.   ibuprofen 600 MG tablet Commonly known as: ADVIL Take 1 tablet (600 mg total) by mouth every 6 (six) hours.   ondansetron 8 MG tablet Commonly known as: Zofran Take 1 tablet (8 mg total) by mouth every 8 (eight) hours as needed for up to 7 days for nausea or vomiting.       ASK your doctor about these medications    diazepam 5 MG tablet Commonly known as: Valium Take 1 tablet (5 mg total) by mouth as needed for up to 2 doses (prior to MRI). Pt to take 5 mg of Valium 1 hour prior to MRI on 11/02/20. May take an additional 5 mg of Valium 30 minutes later (30 minutes prior to MRI) if needed.   magnesium citrate Soln Take 150 mL p.o. twice daily for the next 3 days.               Discharge Care Instructions  (From admission, onward)           Start     Ordered   11/27/20 0000  Discharge wound care:       Comments: Keep the site clean and dry. No submerging (bathing or swimming) for 7 days.   11/27/20 1321            Follow-up Information     Greggory Keen, MD Follow up.   Specialties: Interventional Radiology, Radiology Why: Televisit with Dr. Santiago Bumpers in 4 weeks. Our office will call and set up the appointmenet with you. Contact information: [address] [place] [address] [phone]                  Electronically Signed: Tera Mater, PA-C 11/27/2020, 3:31 PM   I have spent Less Than 30 Minutes discharging Shelley Smith.

## 2020-11-27 NOTE — Discharge Instructions (Signed)
Uterine Artery Embolization, Care After  This sheet gives you information about how to care for yourself after your procedure. Your health care provider may also give you more specific instructions. If you have problems or questions, contact your health care provider.  What can I expect after the procedure? After your procedure, it is common to have pain and discomfort in the abdomen. You will be given prescription pain medicines to control this if the pain is severe.  Take Ibuprofen every 6 hours Take opioids as needed for severe pain Take Zofran up to three times a day as needed/ if become nauseous after taking opioids Take Colace up to two times a day as needed/ if become constipated after taking opioids  Follow these instructions at home: Puncture site care Follow instructions from your health care provider about how to take care of your incision. Make sure you:  - Ok to shower 24 hours following procedure. No further dressing changes needed - ensure area remains clean and dry until fully healed.  - No submerging (swimming, bathing) for 7 days post-procedure. Check your puncture site area every day for signs of infection. Check for: - Redness, swelling, or pain. - Fluid or blood. - Warmth. - Pus or a bad smell. Activity Rest as often as needing during the first few days of recovery.  General instructions To prevent or treat constipation while you are taking prescription pain medicine, your health care provider may recommend that you: - Drink enough fluid to keep your urine clear or pale yellow. - Take over-the-counter or prescription medicines. - Eat foods that are high in fiber, such as fresh fruits and vegetables, whole grains, and beans. - Limit foods that are high in fat and processed sugars, such as fried and sweet foods. - Do not use any products that contain nicotine or tobacco, such as cigarettes and e-cigarettes. If you need help quitting, ask your health care provider. -  Keep all follow-up visits as told by your health care provider. This is important.  3-4 week televisit with the doctor who performed the procedure. Our office will call you to set up the appointment.   Contact a health care provider if: You cannot pass gas. You are unable to have a bowel movement within 3 days. You have a skin rash.  Get help right away if: You have a fever. You have severe or lasting pain in your abdomen, shoulder, or back. You have trouble swallowing or breathing. You have severe weakness or dizziness. You have chest pain or shortness of breath.   This information is not intended to replace advice given to you by your health care provider. Make sure you discuss any questions you have with your health care provider.  

## 2020-11-27 NOTE — Progress Notes (Signed)
Wasted 37ml of dilaudid PCA with Guadlupe Spanish, RN. Could not waste in pxysis as was not started on floor.

## 2020-12-24 ENCOUNTER — Encounter: Payer: Self-pay | Admitting: *Deleted

## 2020-12-24 ENCOUNTER — Other Ambulatory Visit: Payer: Self-pay

## 2020-12-24 ENCOUNTER — Ambulatory Visit
Admission: RE | Admit: 2020-12-24 | Discharge: 2020-12-24 | Disposition: A | Discharge status: Home / Self Care | Payer: Medicaid Other | Source: Ambulatory Visit | Attending: Student | Admitting: Student

## 2020-12-24 DIAGNOSIS — D259 Leiomyoma of uterus, unspecified: Secondary | ICD-10-CM

## 2020-12-24 HISTORY — PX: IR RADIOLOGIST EVAL & MGMT: IMG5224

## 2020-12-24 NOTE — Progress Notes (Signed)
Patient ID: Shelley Smith, female   DOB: March 27, 1976, 45 y.o.   MRN: [id number]       Chief Complaint:  Uterine fibroids  Referring Physician(s): Dr. Elly Modena  History of Present Illness: Shelley Smith is a 45 y.o. female with symptomatic uterine fibroids and dysfunctional uterine bleeding.  Patient is a G4 P3 A1.  Status post previous tubal ligation.  She is now approximate 4-week status post successful uterine fibroid embolization.  Procedure performed at [facility] on 11/26/2020.  Overall she has recovered at home over the last 4 weeks.  Minor vaginal spotting, not unexpected post embolization.  She did have a urinary tract infection a few days after the procedure which responded to antibiotics.  She has completed this antibiotic course.  She has not had a period since the procedure.  She no longer requires prescription strength pain medicine.  Occasional Advil scratch that she still requires occasional Advil.  She reports moderate constipation since the procedure.  Past Medical History:  Diagnosis Date   Diabetes mellitus without complication ([place])     Past Surgical History:  Procedure Laterality Date   CESAREAN SECTION     CHOLECYSTECTOMY     IR ANGIOGRAM PELVIS SELECTIVE OR SUPRASELECTIVE  11/26/2020   IR ANGIOGRAM SELECTIVE EACH ADDITIONAL VESSEL  11/26/2020   IR ANGIOGRAM SELECTIVE EACH ADDITIONAL VESSEL  11/26/2020   IR EMBO TUMOR ORGAN ISCHEMIA INFARCT INC GUIDE ROADMAPPING  11/26/2020   IR RADIOLOGIST EVAL & MGMT  10/22/2020   IR US GUIDE VASC ACCESS LEFT  11/26/2020   IR US GUIDE VASC ACCESS RIGHT  11/26/2020   TONSILLECTOMY     TUBAL LIGATION      Allergies: Codeine, Other, and Penicillins  Medications: Prior to Admission medications   Medication Sig Start Date End Date Taking? Authorizing Provider  acetaminophen (TYLENOL) 500 MG tablet Take 1,000 mg by mouth every 6 (six) hours as needed for moderate pain.    [provider]  diazepam (VALIUM) 5  MG tablet Take 1 tablet (5 mg total) by mouth as needed for up to 2 doses (prior to MRI). Pt to take 5 mg of Valium 1 hour prior to MRI on 11/02/20. May take an additional 5 mg of Valium 30 minutes later (30 minutes prior to MRI) if needed. Patient not taking: No sig reported 11/04/20   Docia Barrier, PA  docusate sodium (COLACE) 100 MG capsule Take 1 capsule (100 mg total) by mouth 2 (two) times daily. 11/27/20   Han, Aimee H, PA-C  ferrous sulfate 324 MG TBEC Take 324 mg by mouth.    [provider]  ibuprofen (ADVIL) 600 MG tablet Take 1 tablet (600 mg total) by mouth every 6 (six) hours. 11/27/20   Han, Aimee H, PA-C  magnesium citrate SOLN Take 150 mL p.o. twice daily for the next 3 days. Patient not taking: Reported on 11/26/2020 10/30/20   Eliezer Lofts, FNP     Family History  Problem Relation Age of Onset   Cancer Maternal Uncle     Social History   Socioeconomic History   Marital status: Significant Other    Spouse name: Not on file   Number of children: Not on file   Years of education: Not on file   Highest education level: Not on file  Occupational History   Not on file  Tobacco Use   Smoking status: Never   Smokeless tobacco: Never  Vaping Use   Vaping Use: Never used  Substance and  Sexual Activity   Alcohol use: Not Currently   Drug use: Never   Sexual activity: Yes    Birth control/protection: Surgical  Other Topics Concern   Not on file  Social History Narrative   Not on file   Social Determinants of Health   Financial Resource Strain: Not on file  Food Insecurity: Not on file  Transportation Needs: Not on file  Physical Activity: Not on file  Stress: Not on file  Social Connections: Not on file     Review of Systems  Review of Systems: A 12 point ROS discussed and pertinent positives are indicated in the HPI above.  All other systems are negative.  Physical Exam No direct physical exam was performed, telephone health visit only  today because of COVID pandemic Vital Signs: There were no vitals taken for this visit.  Imaging: IR Angiogram Pelvis Selective Or Supraselective  Result Date: 11/26/2020 INDICATION: Symptomatic uterine fibroids, abnormal menstrual bleeding EXAM: UTERINE FIBROID EMBOLIZATION Date:  11/26/2020 11/26/2020 3:31 pm Radiologist:  Jerilynn Mages. Daryll Brod, MD Guidance:  Ultrasound and fluoroscopic MEDICATIONS: 1 g vancomycin. The antibiotic was administered within 1 hour of the procedure ANESTHESIA/SEDATION: Fentanyl 200 mcg IV; Versed 6.0 mg IV Moderate Sedation Time:  60 minutes The patient was continuously monitored during the procedure by the interventional radiology nurse under my direct supervision. CONTRAST:  <See Chart> OMNIPAQUE IOHEXOL 300 MG/ML SOLN, 60mL OMNIPAQUE IOHEXOL 300 MG/ML SOLN, 2mL OMNIPAQUE IOHEXOL 300 MG/ML SOLN FLUOROSCOPY TIME:  Fluoroscopy Time: 13 minutes 24 seconds (1,926 mGy). COMPLICATIONS: None immediate. PROCEDURE: Informed consent was obtained from the patient following explanation of the procedure, risks, benefits and alternatives. The patient understands, agrees and consents for the procedure. All questions were addressed. A time out was performed. Maximal barrier sterile technique utilized including caps, mask, sterile gowns, sterile gloves, large sterile drape, hand hygiene, and ChloraPrep. Under sterile conditions and local anesthesia, bilateral ultrasound micropuncture access performed of the common femoral arteries. Ultrasound images obtained for documentation of the patent common femoral arteries. Over Bentson guidewires, 5 French sheaths inserted bilaterally. Initially left uterine access was performed. A C2 catheter was advanced into the left internal iliac artery. Selective left internal iliac angiogram performed. The enlarged tortuous left uterine artery is localized. Microcatheter and guidewire were advanced through the C2 catheter into the left uterine artery. Selective left  uterine angiogram confirms position. Catheter position is safe for embolization. Next, right uterine access was performed. Also with a C2 catheter, the right internal iliac angiograms performed. Internal iliac angiogram confirms a patent tortuous right uterine artery. Microcatheter and guidewire utilized to select the right uterine artery peripherally. Selective right uterine angiogram performed. Next, bilateral simultaneous uterine angiograms performed to completely visualize the uterine vasculature. Both catheter positions are safe for embolization. For embolization, 2 vials 500-700 micron embospheres and 1 vial of 700-900 micron embospheres were used on each side to perform complete bilateral uterine artery embolization. Post embolization angiogram confirms near complete stasis within the uterine vasculature bilaterally. Access removed. Hemostasis obtained with the ExoSeal device bilaterally. No immediate complication. Patient tolerated the procedure well. The patient tolerated the procedure well. No immediate complication. IMPRESSION: Successful bilateral uterine artery embolization (U [facility] E) Electronically Signed   By: Jerilynn Mages.  Shannie Kontos M.D.   On: 11/26/2020 16:04   IR Angiogram Selective Each Additional Vessel  Result Date: 11/26/2020 INDICATION: Symptomatic uterine fibroids, abnormal menstrual bleeding EXAM: UTERINE FIBROID EMBOLIZATION Date:  11/26/2020 11/26/2020 3:31 pm Radiologist:  Jerilynn Mages. Daryll Brod, MD Guidance:  Ultrasound  and fluoroscopic MEDICATIONS: 1 g vancomycin. The antibiotic was administered within 1 hour of the procedure ANESTHESIA/SEDATION: Fentanyl 200 mcg IV; Versed 6.0 mg IV Moderate Sedation Time:  60 minutes The patient was continuously monitored during the procedure by the interventional radiology nurse under my direct supervision. CONTRAST:  <See Chart> OMNIPAQUE IOHEXOL 300 MG/ML SOLN, 57mL OMNIPAQUE IOHEXOL 300 MG/ML SOLN, 25mL OMNIPAQUE IOHEXOL 300 MG/ML SOLN FLUOROSCOPY TIME:  Fluoroscopy  Time: 13 minutes 24 seconds (1,926 mGy). COMPLICATIONS: None immediate. PROCEDURE: Informed consent was obtained from the patient following explanation of the procedure, risks, benefits and alternatives. The patient understands, agrees and consents for the procedure. All questions were addressed. A time out was performed. Maximal barrier sterile technique utilized including caps, mask, sterile gowns, sterile gloves, large sterile drape, hand hygiene, and ChloraPrep. Under sterile conditions and local anesthesia, bilateral ultrasound micropuncture access performed of the common femoral arteries. Ultrasound images obtained for documentation of the patent common femoral arteries. Over Bentson guidewires, 5 French sheaths inserted bilaterally. Initially left uterine access was performed. A C2 catheter was advanced into the left internal iliac artery. Selective left internal iliac angiogram performed. The enlarged tortuous left uterine artery is localized. Microcatheter and guidewire were advanced through the C2 catheter into the left uterine artery. Selective left uterine angiogram confirms position. Catheter position is safe for embolization. Next, right uterine access was performed. Also with a C2 catheter, the right internal iliac angiograms performed. Internal iliac angiogram confirms a patent tortuous right uterine artery. Microcatheter and guidewire utilized to select the right uterine artery peripherally. Selective right uterine angiogram performed. Next, bilateral simultaneous uterine angiograms performed to completely visualize the uterine vasculature. Both catheter positions are safe for embolization. For embolization, 2 vials 500-700 micron embospheres and 1 vial of 700-900 micron embospheres were used on each side to perform complete bilateral uterine artery embolization. Post embolization angiogram confirms near complete stasis within the uterine vasculature bilaterally. Access removed. Hemostasis obtained  with the ExoSeal device bilaterally. No immediate complication. Patient tolerated the procedure well. The patient tolerated the procedure well. No immediate complication. IMPRESSION: Successful bilateral uterine artery embolization (U [facility] E) Electronically Signed   By: Jerilynn Mages.  Reinhardt Licausi M.D.   On: 11/26/2020 16:04   IR Angiogram Selective Each Additional Vessel  Result Date: 11/26/2020 INDICATION: Symptomatic uterine fibroids, abnormal menstrual bleeding EXAM: UTERINE FIBROID EMBOLIZATION Date:  11/26/2020 11/26/2020 3:31 pm Radiologist:  Jerilynn Mages. Daryll Brod, MD Guidance:  Ultrasound and fluoroscopic MEDICATIONS: 1 g vancomycin. The antibiotic was administered within 1 hour of the procedure ANESTHESIA/SEDATION: Fentanyl 200 mcg IV; Versed 6.0 mg IV Moderate Sedation Time:  60 minutes The patient was continuously monitored during the procedure by the interventional radiology nurse under my direct supervision. CONTRAST:  <See Chart> OMNIPAQUE IOHEXOL 300 MG/ML SOLN, 41mL OMNIPAQUE IOHEXOL 300 MG/ML SOLN, 65mL OMNIPAQUE IOHEXOL 300 MG/ML SOLN FLUOROSCOPY TIME:  Fluoroscopy Time: 13 minutes 24 seconds (1,926 mGy). COMPLICATIONS: None immediate. PROCEDURE: Informed consent was obtained from the patient following explanation of the procedure, risks, benefits and alternatives. The patient understands, agrees and consents for the procedure. All questions were addressed. A time out was performed. Maximal barrier sterile technique utilized including caps, mask, sterile gowns, sterile gloves, large sterile drape, hand hygiene, and ChloraPrep. Under sterile conditions and local anesthesia, bilateral ultrasound micropuncture access performed of the common femoral arteries. Ultrasound images obtained for documentation of the patent common femoral arteries. Over Bentson guidewires, 5 French sheaths inserted bilaterally. Initially left uterine access was performed. A C2 catheter was  advanced into the left internal iliac artery. Selective left  internal iliac angiogram performed. The enlarged tortuous left uterine artery is localized. Microcatheter and guidewire were advanced through the C2 catheter into the left uterine artery. Selective left uterine angiogram confirms position. Catheter position is safe for embolization. Next, right uterine access was performed. Also with a C2 catheter, the right internal iliac angiograms performed. Internal iliac angiogram confirms a patent tortuous right uterine artery. Microcatheter and guidewire utilized to select the right uterine artery peripherally. Selective right uterine angiogram performed. Next, bilateral simultaneous uterine angiograms performed to completely visualize the uterine vasculature. Both catheter positions are safe for embolization. For embolization, 2 vials 500-700 micron embospheres and 1 vial of 700-900 micron embospheres were used on each side to perform complete bilateral uterine artery embolization. Post embolization angiogram confirms near complete stasis within the uterine vasculature bilaterally. Access removed. Hemostasis obtained with the ExoSeal device bilaterally. No immediate complication. Patient tolerated the procedure well. The patient tolerated the procedure well. No immediate complication. IMPRESSION: Successful bilateral uterine artery embolization (U [facility] E) Electronically Signed   By: Jerilynn Mages.  Lillia Lengel M.D.   On: 11/26/2020 16:04   IR US Guide Vasc Access Left  Result Date: 11/26/2020 INDICATION: Symptomatic uterine fibroids, abnormal menstrual bleeding EXAM: UTERINE FIBROID EMBOLIZATION Date:  11/26/2020 11/26/2020 3:31 pm Radiologist:  Jerilynn Mages. Daryll Brod, MD Guidance:  Ultrasound and fluoroscopic MEDICATIONS: 1 g vancomycin. The antibiotic was administered within 1 hour of the procedure ANESTHESIA/SEDATION: Fentanyl 200 mcg IV; Versed 6.0 mg IV Moderate Sedation Time:  60 minutes The patient was continuously monitored during the procedure by the interventional radiology nurse under my  direct supervision. CONTRAST:  <See Chart> OMNIPAQUE IOHEXOL 300 MG/ML SOLN, 14mL OMNIPAQUE IOHEXOL 300 MG/ML SOLN, 6mL OMNIPAQUE IOHEXOL 300 MG/ML SOLN FLUOROSCOPY TIME:  Fluoroscopy Time: 13 minutes 24 seconds (1,926 mGy). COMPLICATIONS: None immediate. PROCEDURE: Informed consent was obtained from the patient following explanation of the procedure, risks, benefits and alternatives. The patient understands, agrees and consents for the procedure. All questions were addressed. A time out was performed. Maximal barrier sterile technique utilized including caps, mask, sterile gowns, sterile gloves, large sterile drape, hand hygiene, and ChloraPrep. Under sterile conditions and local anesthesia, bilateral ultrasound micropuncture access performed of the common femoral arteries. Ultrasound images obtained for documentation of the patent common femoral arteries. Over Bentson guidewires, 5 French sheaths inserted bilaterally. Initially left uterine access was performed. A C2 catheter was advanced into the left internal iliac artery. Selective left internal iliac angiogram performed. The enlarged tortuous left uterine artery is localized. Microcatheter and guidewire were advanced through the C2 catheter into the left uterine artery. Selective left uterine angiogram confirms position. Catheter position is safe for embolization. Next, right uterine access was performed. Also with a C2 catheter, the right internal iliac angiograms performed. Internal iliac angiogram confirms a patent tortuous right uterine artery. Microcatheter and guidewire utilized to select the right uterine artery peripherally. Selective right uterine angiogram performed. Next, bilateral simultaneous uterine angiograms performed to completely visualize the uterine vasculature. Both catheter positions are safe for embolization. For embolization, 2 vials 500-700 micron embospheres and 1 vial of 700-900 micron embospheres were used on each side to perform  complete bilateral uterine artery embolization. Post embolization angiogram confirms near complete stasis within the uterine vasculature bilaterally. Access removed. Hemostasis obtained with the ExoSeal device bilaterally. No immediate complication. Patient tolerated the procedure well. The patient tolerated the procedure well. No immediate complication. IMPRESSION: Successful bilateral uterine artery embolization (U F  E) Electronically Signed   By: Jerilynn Mages.  Kyria Bumgardner M.D.   On: 11/26/2020 16:04   IR US Guide Vasc Access Right  Result Date: 11/26/2020 INDICATION: Symptomatic uterine fibroids, abnormal menstrual bleeding EXAM: UTERINE FIBROID EMBOLIZATION Date:  11/26/2020 11/26/2020 3:31 pm Radiologist:  Jerilynn Mages. Daryll Brod, MD Guidance:  Ultrasound and fluoroscopic MEDICATIONS: 1 g vancomycin. The antibiotic was administered within 1 hour of the procedure ANESTHESIA/SEDATION: Fentanyl 200 mcg IV; Versed 6.0 mg IV Moderate Sedation Time:  60 minutes The patient was continuously monitored during the procedure by the interventional radiology nurse under my direct supervision. CONTRAST:  <See Chart> OMNIPAQUE IOHEXOL 300 MG/ML SOLN, 44mL OMNIPAQUE IOHEXOL 300 MG/ML SOLN, 20mL OMNIPAQUE IOHEXOL 300 MG/ML SOLN FLUOROSCOPY TIME:  Fluoroscopy Time: 13 minutes 24 seconds (1,926 mGy). COMPLICATIONS: None immediate. PROCEDURE: Informed consent was obtained from the patient following explanation of the procedure, risks, benefits and alternatives. The patient understands, agrees and consents for the procedure. All questions were addressed. A time out was performed. Maximal barrier sterile technique utilized including caps, mask, sterile gowns, sterile gloves, large sterile drape, hand hygiene, and ChloraPrep. Under sterile conditions and local anesthesia, bilateral ultrasound micropuncture access performed of the common femoral arteries. Ultrasound images obtained for documentation of the patent common femoral arteries. Over Bentson  guidewires, 5 French sheaths inserted bilaterally. Initially left uterine access was performed. A C2 catheter was advanced into the left internal iliac artery. Selective left internal iliac angiogram performed. The enlarged tortuous left uterine artery is localized. Microcatheter and guidewire were advanced through the C2 catheter into the left uterine artery. Selective left uterine angiogram confirms position. Catheter position is safe for embolization. Next, right uterine access was performed. Also with a C2 catheter, the right internal iliac angiograms performed. Internal iliac angiogram confirms a patent tortuous right uterine artery. Microcatheter and guidewire utilized to select the right uterine artery peripherally. Selective right uterine angiogram performed. Next, bilateral simultaneous uterine angiograms performed to completely visualize the uterine vasculature. Both catheter positions are safe for embolization. For embolization, 2 vials 500-700 micron embospheres and 1 vial of 700-900 micron embospheres were used on each side to perform complete bilateral uterine artery embolization. Post embolization angiogram confirms near complete stasis within the uterine vasculature bilaterally. Access removed. Hemostasis obtained with the ExoSeal device bilaterally. No immediate complication. Patient tolerated the procedure well. The patient tolerated the procedure well. No immediate complication. IMPRESSION: Successful bilateral uterine artery embolization (U [facility] E) Electronically Signed   By: Jerilynn Mages.  Veera Stapleton M.D.   On: 11/26/2020 16:04   IR EMBO TUMOR ORGAN ISCHEMIA INFARCT INC GUIDE ROADMAPPING  Result Date: 11/26/2020 INDICATION: Symptomatic uterine fibroids, abnormal menstrual bleeding EXAM: UTERINE FIBROID EMBOLIZATION Date:  11/26/2020 11/26/2020 3:31 pm Radiologist:  Jerilynn Mages. Daryll Brod, MD Guidance:  Ultrasound and fluoroscopic MEDICATIONS: 1 g vancomycin. The antibiotic was administered within 1 hour of the  procedure ANESTHESIA/SEDATION: Fentanyl 200 mcg IV; Versed 6.0 mg IV Moderate Sedation Time:  60 minutes The patient was continuously monitored during the procedure by the interventional radiology nurse under my direct supervision. CONTRAST:  <See Chart> OMNIPAQUE IOHEXOL 300 MG/ML SOLN, 75mL OMNIPAQUE IOHEXOL 300 MG/ML SOLN, 64mL OMNIPAQUE IOHEXOL 300 MG/ML SOLN FLUOROSCOPY TIME:  Fluoroscopy Time: 13 minutes 24 seconds (1,926 mGy). COMPLICATIONS: None immediate. PROCEDURE: Informed consent was obtained from the patient following explanation of the procedure, risks, benefits and alternatives. The patient understands, agrees and consents for the procedure. All questions were addressed. A time out was performed. Maximal barrier sterile technique utilized including caps, mask,  sterile gowns, sterile gloves, large sterile drape, hand hygiene, and ChloraPrep. Under sterile conditions and local anesthesia, bilateral ultrasound micropuncture access performed of the common femoral arteries. Ultrasound images obtained for documentation of the patent common femoral arteries. Over Bentson guidewires, 5 French sheaths inserted bilaterally. Initially left uterine access was performed. A C2 catheter was advanced into the left internal iliac artery. Selective left internal iliac angiogram performed. The enlarged tortuous left uterine artery is localized. Microcatheter and guidewire were advanced through the C2 catheter into the left uterine artery. Selective left uterine angiogram confirms position. Catheter position is safe for embolization. Next, right uterine access was performed. Also with a C2 catheter, the right internal iliac angiograms performed. Internal iliac angiogram confirms a patent tortuous right uterine artery. Microcatheter and guidewire utilized to select the right uterine artery peripherally. Selective right uterine angiogram performed. Next, bilateral simultaneous uterine angiograms performed to completely  visualize the uterine vasculature. Both catheter positions are safe for embolization. For embolization, 2 vials 500-700 micron embospheres and 1 vial of 700-900 micron embospheres were used on each side to perform complete bilateral uterine artery embolization. Post embolization angiogram confirms near complete stasis within the uterine vasculature bilaterally. Access removed. Hemostasis obtained with the ExoSeal device bilaterally. No immediate complication. Patient tolerated the procedure well. The patient tolerated the procedure well. No immediate complication. IMPRESSION: Successful bilateral uterine artery embolization (U [facility] E) Electronically Signed   By: Jerilynn Mages.  Ericha Whittingham M.D.   On: 11/26/2020 16:04    Labs:  CBC: Recent Labs    07/28/20 1556 11/26/20 1211  WBC 9.1 8.8  HGB 9.6* 11.5*  HCT 30.3* 37.5  PLT 433* 407*    COAGS: Recent Labs    11/26/20 1211  INR 1.0  APTT 27    BMP: Recent Labs    07/28/20 1543 07/28/20 1556 11/26/20 1211  NA  --  134* 136  K  --  4.1 4.0  CL  --  99 103  CO2  --  23 25  GLUCOSE  --  87 119*  BUN  --  11 11  CALCIUM  --  8.8 9.4  CREATININE 0.80 0.85 0.80  GFRNONAA  --  82 >60  GFRAA  --  95  --     LIVER FUNCTION TESTS: Recent Labs    07/28/20 1556  BILITOT 0.5  AST 13  ALT 16  PROT 6.8     Assessment and Plan:  4-week status post successful uterine fibroid embolization performed at [facility].  She had an overnight observation recovery and discharged the following day.  She has recovered at home the last 4 weeks.  She is only requiring occasional Advil at this point.  Recent postprocedure urinary tract infection treated successfully with antibiotics.  These symptoms have resolved.  Minor but improving vaginal spotting.  This is expected post embolization.  Moderate constipation posttreatment.  Plan: Continue occasional Advil as needed.  She was encouraged to add a laxative for a few days such as over-the-counter  Citrucel.  Telephone follow-up at 3 and 6 months.  Posttreatment MRI will be performed at 6 months.   Electronically Signed: Greggory Keen 12/24/2020, 9:19 AM   I spent a total of    40 Minutes in remote  clinical consultation, greater than 50% of which was counseling/coordinating care for this patient status post uterine fibroid was Asian.    Visit type: Audio only (telephone). Audio (no video) only due to patient's lack of internet/smartphone capability. Alternative for in-person consultation  at [place], [place] [address] [place], [place], [place]. This visit type was conducted due to national recommendations for restrictions regarding the COVID-19 Pandemic (e.g. social distancing).  This format is felt to be most appropriate for this patient at this time.  All issues noted in this document were discussed and addressed.

## 2020-12-28 ENCOUNTER — Encounter: Payer: Self-pay | Admitting: Obstetrics & Gynecology

## 2020-12-28 ENCOUNTER — Other Ambulatory Visit (HOSPITAL_COMMUNITY)
Admission: RE | Admit: 2020-12-28 | Discharge: 2020-12-28 | Disposition: A | Discharge status: Home / Self Care | Payer: Medicaid Other | Source: Ambulatory Visit | Attending: Obstetrics & Gynecology | Admitting: Obstetrics & Gynecology

## 2020-12-28 ENCOUNTER — Other Ambulatory Visit: Payer: Self-pay

## 2020-12-28 ENCOUNTER — Ambulatory Visit: Payer: Medicaid Other | Admitting: Obstetrics & Gynecology

## 2020-12-28 VITALS — BP 132/82 | HR 65 | Resp 16 | Ht 61.0 in | Wt 171.0 lb

## 2020-12-28 DIAGNOSIS — N898 Other specified noninflammatory disorders of vagina: Secondary | ICD-10-CM

## 2020-12-28 DIAGNOSIS — N939 Abnormal uterine and vaginal bleeding, unspecified: Secondary | ICD-10-CM

## 2020-12-28 LAB — POCT URINALYSIS DIPSTICK
Bilirubin, UA: NEGATIVE
Glucose, UA: NEGATIVE
Ketones, UA: NEGATIVE
Leukocytes, UA: NEGATIVE
Nitrite, UA: NEGATIVE
Protein, UA: NEGATIVE
Spec Grav, UA: 1.02 (ref 1.010–1.025)
Urobilinogen, UA: 0.2 E.U./dL
pH, UA: 5 (ref 5.0–8.0)

## 2020-12-28 NOTE — Progress Notes (Signed)
   Subjective:    Patient ID: Shelley Smith, female    DOB: 04-Sep-1975, 45 y.o.   MRN: [id number]  HPI  45 yo female presents s/p [place].  Pt c/o vaginal bleeding and vaginal odor.  Patient had numerous fibroids approximately 25 that need embolization.  Patient stayed overnight in the hospital and did well.  She did come back with a urinary tract infection and was treated in the emergency room.  Since then the patient has had abdominal pain and sporadic bleeding.  The bleeding is pantiliner only.  She also has been having hot flashes and flushes.  She does not believe that this is a fever.  She thinks it could be menopause.  This is only been occurring since the procedure.  Patient is not sexually active at this time.  Review of Systems  Constitutional:        Hot flashes  Respiratory: Negative.    Cardiovascular: Negative.   Gastrointestinal:  Positive for abdominal pain.  Genitourinary:  Positive for pelvic pain, vaginal bleeding and vaginal discharge.  Psychiatric/Behavioral: Negative.        Objective:   Physical Exam Vitals reviewed.  Constitutional:      General: She is not in acute distress.    Appearance: She is well-developed.  HENT:     Head: Normocephalic and atraumatic.  Eyes:     Conjunctiva/sclera: Conjunctivae normal.  Cardiovascular:     Rate and Rhythm: Normal rate.  Pulmonary:     Effort: Pulmonary effort is normal.  Abdominal:     General: Abdomen is flat.     Palpations: Abdomen is soft.     Comments: Palpable fibroids through abdominal wall.  Patient is a little tender over some of them.  No rebound no guarding  Genitourinary:    Comments: Tanner V Vulva:  No lesion Vagina:  Pink, no lesions, small amount of discharge and old blood Cervix:  No CMT Uterus: Mildly tender on bimanual.  Enlarged secondary to fibroids. Right adnexa--nontender but limited by uterus Left adnexa--nontender but limited by uterus   Skin:    General: Skin is warm and dry.   Neurological:     Mental Status: She is alert and oriented to person, place, and time.  Psychiatric:        Mood and Affect: Mood normal.  Vitals:   12/28/20 1535  BP: 132/82  Pulse: 65  Resp: 16  Weight: 171 lb (77.6 kg)  Height: 5\' 1"  (1.549 m)       Assessment & Plan:  45 year old female with vaginal bleeding and odor and continued abdominal pain after uterine artery embolization  Rule out urine infection CBC Check for vaginitis with Aptima FSH for hot flashes We will base treatment on results.  33 minutes was spent with patient during exam and counseling, review of records, documentation.

## 2020-12-29 LAB — CERVICOVAGINAL ANCILLARY ONLY
Bacterial Vaginitis (gardnerella): NEGATIVE
Candida Glabrata: NEGATIVE
Candida Vaginitis: NEGATIVE
Comment: NEGATIVE
Comment: NEGATIVE
Comment: NEGATIVE

## 2020-12-30 LAB — URINE CULTURE
MICRO NUMBER:: 12134395
Result:: NO GROWTH
SPECIMEN QUALITY:: ADEQUATE

## 2021-04-18 DIAGNOSIS — E041 Nontoxic single thyroid nodule: Secondary | ICD-10-CM | POA: Insufficient documentation

## 2021-04-18 DIAGNOSIS — E1165 Type 2 diabetes mellitus with hyperglycemia: Secondary | ICD-10-CM | POA: Insufficient documentation

## 2021-04-28 ENCOUNTER — Other Ambulatory Visit: Payer: Self-pay | Admitting: Interventional Radiology

## 2021-04-28 DIAGNOSIS — D250 Submucous leiomyoma of uterus: Secondary | ICD-10-CM

## 2021-05-14 LAB — HM COLONOSCOPY

## 2021-06-11 ENCOUNTER — Telehealth: Payer: Self-pay

## 2021-06-11 NOTE — Telephone Encounter (Signed)
Valium called into patients pharmacy, [facility], for her to take prior to MRI on 06/25/2021, per Dr. Annamaria Boots. Pt aware of prescription instructions and instructed to have a driver the day of taking the medication as it may cause drowsiness. Pt also advised not to take any other muscle relaxer's or anxiety medications with this prescription. Pt verbalized understanding.

## 2021-06-25 ENCOUNTER — Ambulatory Visit (HOSPITAL_COMMUNITY): Admission: RE | Admit: 2021-06-25 | Payer: Medicaid Other | Source: Ambulatory Visit

## 2021-06-25 ENCOUNTER — Encounter (HOSPITAL_COMMUNITY): Payer: Self-pay

## 2021-06-28 ENCOUNTER — Ambulatory Visit (HOSPITAL_COMMUNITY)
Admission: RE | Admit: 2021-06-28 | Discharge: 2021-06-28 | Disposition: A | Discharge status: Home / Self Care | Payer: Medicaid Other | Source: Ambulatory Visit | Attending: Interventional Radiology | Admitting: Interventional Radiology

## 2021-06-28 NOTE — Progress Notes (Signed)
Pt was a no call no show for her MRI Pelvis wo/w scheduled today at 7am. I called her to see if she would be available to come later today and her mailbox was full. Her order will be placed back into ancillary orders for rescheduling.

## 2021-07-01 ENCOUNTER — Telehealth: Payer: Medicaid Other

## 2021-07-11 ENCOUNTER — Ambulatory Visit (HOSPITAL_COMMUNITY): Admission: RE | Admit: 2021-07-11 | Payer: Medicaid Other | Source: Ambulatory Visit

## 2021-07-15 ENCOUNTER — Ambulatory Visit
Admission: RE | Admit: 2021-07-15 | Discharge: 2021-07-15 | Disposition: A | Discharge status: Home / Self Care | Payer: Medicaid Other | Source: Ambulatory Visit | Attending: Interventional Radiology | Admitting: Interventional Radiology

## 2021-07-15 ENCOUNTER — Other Ambulatory Visit: Payer: Self-pay

## 2021-07-15 DIAGNOSIS — D250 Submucous leiomyoma of uterus: Secondary | ICD-10-CM

## 2021-08-02 ENCOUNTER — Ambulatory Visit (HOSPITAL_COMMUNITY)
Admission: RE | Admit: 2021-08-02 | Discharge: 2021-08-02 | Disposition: A | Discharge status: Home / Self Care | Payer: Medicaid Other | Source: Ambulatory Visit | Attending: Interventional Radiology | Admitting: Interventional Radiology

## 2021-08-02 ENCOUNTER — Other Ambulatory Visit: Payer: Self-pay

## 2021-08-02 DIAGNOSIS — D250 Submucous leiomyoma of uterus: Secondary | ICD-10-CM | POA: Diagnosis not present

## 2021-08-02 MED ORDER — GADOBUTROL 1 MMOL/ML IV SOLN
7.5000 mL | Freq: Once | INTRAVENOUS | Status: AC | PRN
  Administered 2021-08-02: 7.5 mL via INTRAVENOUS

## 2021-08-06 ENCOUNTER — Encounter: Payer: Self-pay | Admitting: *Deleted

## 2021-08-06 ENCOUNTER — Ambulatory Visit
Admission: RE | Admit: 2021-08-06 | Discharge: 2021-08-06 | Disposition: A | Discharge status: Home / Self Care | Payer: Medicaid Other | Source: Ambulatory Visit | Attending: Interventional Radiology | Admitting: Interventional Radiology

## 2021-08-06 ENCOUNTER — Other Ambulatory Visit: Payer: Self-pay

## 2021-08-06 HISTORY — PX: IR RADIOLOGIST EVAL & MGMT: IMG5224

## 2021-08-06 NOTE — Progress Notes (Signed)
Patient ID: Shelley Smith, female   DOB: March 25, 1976, 46 y.o.   MRN: [id number]       Chief Complaint:  Uterine fibroid  Referring Physician(s):  Dr. Elly Modena  History of Present Illness: Towanda Smith is a 46 y.o. female with known large multiple uterine fibroids and dysfunctional uterine bleeding.  She is now approximately 21-month status post uterine fibroid embolization.  In review, she has recovered very well.  She has only had 1 menstrual cycle back in October which lasted about 4 days with very light bleeding.  No heavy menstrual period.  No intra-period Bleeding.  No significant pelvic pain or cramping.  Overall she is doing very well and pleased with the outcome.  She currently is asymptomatic.  No other urinary tract or bowel symptoms.  Posttreatment MRI confirms significant overall volume reduction in the fibroid uterus.  Majority of fibroids are now avascular.  No unexpected findings by MRI.  Past Medical History:  Diagnosis Date   Diabetes mellitus without complication ([place])     Past Surgical History:  Procedure Laterality Date   CESAREAN SECTION     CHOLECYSTECTOMY     IR ANGIOGRAM PELVIS SELECTIVE OR SUPRASELECTIVE  11/26/2020   IR ANGIOGRAM SELECTIVE EACH ADDITIONAL VESSEL  11/26/2020   IR ANGIOGRAM SELECTIVE EACH ADDITIONAL VESSEL  11/26/2020   IR EMBO TUMOR ORGAN ISCHEMIA INFARCT INC GUIDE ROADMAPPING  11/26/2020   IR RADIOLOGIST EVAL & MGMT  10/22/2020   IR RADIOLOGIST EVAL & MGMT  12/24/2020   IR US GUIDE VASC ACCESS LEFT  11/26/2020   IR US GUIDE VASC ACCESS RIGHT  11/26/2020   TONSILLECTOMY     TUBAL LIGATION      Allergies: Codeine, Other, and Penicillins  Medications: Prior to Admission medications   Medication Sig Start Date End Date Taking? Authorizing Provider  acetaminophen (TYLENOL) 500 MG tablet Take 1,000 mg by mouth every 6 (six) hours as needed for moderate pain.    [provider]  docusate sodium (COLACE) 100 MG capsule Take 1 capsule  (100 mg total) by mouth 2 (two) times daily. 11/27/20   Han, Aimee H, PA-C  ferrous sulfate 324 MG TBEC Take 324 mg by mouth.    [provider]  ibuprofen (ADVIL) 600 MG tablet Take 1 tablet (600 mg total) by mouth every 6 (six) hours. 11/27/20   Han, Aimee H, PA-C     Family History  Problem Relation Age of Onset   Cancer Maternal Uncle     Social History   Socioeconomic History   Marital status: Significant Other    Spouse name: Not on file   Number of children: Not on file   Years of education: Not on file   Highest education level: Not on file  Occupational History   Not on file  Tobacco Use   Smoking status: Never   Smokeless tobacco: Never  Vaping Use   Vaping Use: Never used  Substance and Sexual Activity   Alcohol use: Not Currently   Drug use: Never   Sexual activity: Yes    Birth control/protection: Surgical  Other Topics Concern   Not on file  Social History Narrative   Not on file   Social Determinants of Health   Financial Resource Strain: Not on file  Food Insecurity: Not on file  Transportation Needs: Not on file  Physical Activity: Not on file  Stress: Not on file  Social Connections: Not on file     Review of Systems  Review of  Systems: A 12 point ROS discussed and pertinent positives are indicated in the HPI above.  All other systems are negative.  Physical Exam No direct physical exam was performed, telephone health visit only today Vital Signs: There were no vitals taken for this visit.  Imaging: MR PELVIS W WO CONTRAST  Result Date: 08/03/2021 CLINICAL DATA:  Follow-up uterine fibroids. 8 months status post uterine artery embolization. EXAM: MRI PELVIS WITHOUT AND WITH CONTRAST TECHNIQUE: Multiplanar multisequence MR imaging of the pelvis was performed both before and after administration of intravenous contrast. CONTRAST:  7.37mL GADAVIST GADOBUTROL 1 MMOL/ML IV SOLN COMPARISON:  11/07/2020 FINDINGS: Lower Urinary Tract: No  urinary bladder or urethral abnormality identified. Bowel: Unremarkable pelvic bowel loops. Vascular/Lymphatic: Unremarkable. No pathologically enlarged pelvic lymph nodes identified. Reproductive: -- Uterus: Measures 13.4 x 8.9 by 11.4 cm (volume = 710 cm^3). This is decreased from approximately 1350 mL on prior study. Multiple uterine fibroids are again seen throughout the uterus, however these show decreased T2 signal intensity and enhancement since prior study. Majority also show mild decrease in size. A single 2.9 cm subserosal fibroid is seen in the anterior uterine corpus which shows residual contrast enhancement, but no evidence of restricted diffusion. No new or enlarging uterine masses identified. -- Right ovary: Appears normal. No ovarian or adnexal masses identified. -- Left ovary: Appears normal. No ovarian or adnexal masses identified. Other: No peritoneal thickening or abnormal free fluid. Musculoskeletal:  Unremarkable. IMPRESSION: Interval decrease in overall uterine volume and size of individual fibroids. No new or enlarging uterine masses identified. No other acute findings. Electronically Signed   By: Marlaine Hind M.D.   On: 08/03/2021 15:30    Labs:  CBC: Recent Labs    11/26/20 1211  WBC 8.8  HGB 11.5*  HCT 37.5  PLT 407*    COAGS: Recent Labs    11/26/20 1211  INR 1.0  APTT 27    BMP: Recent Labs    11/26/20 1211  NA 136  K 4.0  CL 103  CO2 25  GLUCOSE 119*  BUN 11  CALCIUM 9.4  CREATININE 0.80  GFRNONAA >60    LIVER FUNCTION TESTS: No results for input(s): BILITOT, AST, ALT, ALKPHOS, PROT, ALBUMIN in the last 8760 hours.   Assessment and Plan:  8 month status post uterine fibroid embolization procedure for symptomatic uterine fibroids with heavy menstrual bleeding, pelvic pain and cramping.  Overall she has recovered very well.  Fibroid embolization appears to have triggered menopause as she has not had a period since October 2022.  Posttreatment MRI  findings are concordant with the clinical outcome.  Overall significant volume reduction in the fibroid uterus.  Fibroids demonstrate no significant residual vascularity or enhancement pattern except for 1 small 3 cm residual anterior uterine fibroid.  No additional pelvic pain or cramping.  All questions addressed.  Overall she is pleased with the outcome.  Plan: Return to Dr. Elly Modena for annual GYN care.  Follow-up with [place] as needed.   Electronically Signed: Greggory Keen 08/06/2021, 10:07 AM   I spent a total of    40 Minutes in remote  clinical consultation, greater than 50% of which was counseling/coordinating care for this patient with symptomatic uterine fibroid status post embolization.    Visit type: Audio only (telephone). Audio (no video) only due to patient's lack of internet/smartphone capability. Alternative for in-person consultation at [facility], [address] [place], [place], [place]. This visit type was conducted due to national recommendations for restrictions regarding  the COVID-19 Pandemic (e.g. social distancing).  This format is felt to be most appropriate for this patient at this time.  All issues noted in this document were discussed and addressed.

## 2021-09-09 ENCOUNTER — Ambulatory Visit: Payer: Medicaid Other | Admitting: Obstetrics and Gynecology

## 2021-11-18 ENCOUNTER — Ambulatory Visit: Payer: Medicaid Other | Admitting: Family Medicine

## 2021-11-18 ENCOUNTER — Encounter: Payer: Self-pay | Admitting: Family Medicine

## 2021-11-18 ENCOUNTER — Other Ambulatory Visit (HOSPITAL_COMMUNITY)
Admission: RE | Admit: 2021-11-18 | Discharge: 2021-11-18 | Disposition: A | Discharge status: Home / Self Care | Payer: Medicaid Other | Source: Ambulatory Visit | Attending: Obstetrics and Gynecology | Admitting: Obstetrics and Gynecology

## 2021-11-18 VITALS — BP 148/81 | HR 73 | Ht 61.0 in | Wt 165.0 lb

## 2021-11-18 DIAGNOSIS — E1165 Type 2 diabetes mellitus with hyperglycemia: Secondary | ICD-10-CM

## 2021-11-18 DIAGNOSIS — Z01419 Encounter for gynecological examination (general) (routine) without abnormal findings: Secondary | ICD-10-CM | POA: Insufficient documentation

## 2021-11-18 DIAGNOSIS — Z124 Encounter for screening for malignant neoplasm of cervix: Secondary | ICD-10-CM

## 2021-11-18 DIAGNOSIS — K5903 Drug induced constipation: Secondary | ICD-10-CM | POA: Diagnosis not present

## 2021-11-18 DIAGNOSIS — M5412 Radiculopathy, cervical region: Secondary | ICD-10-CM | POA: Insufficient documentation

## 2021-11-18 DIAGNOSIS — G8929 Other chronic pain: Secondary | ICD-10-CM | POA: Insufficient documentation

## 2021-11-18 MED ORDER — POLYETHYLENE GLYCOL 3350 17 G PO PACK: 17.0000 g | PACK | Freq: Every day | ORAL | 0 refills | Status: AC

## 2021-11-18 NOTE — Progress Notes (Signed)
tMammogram scheduled for 12/08/21

## 2021-11-18 NOTE — Progress Notes (Signed)
Subjective:     Shelley Smith is a 46 y.o. female and is here for a comprehensive physical exam. The patient reports problems - constipation from [facility]. Also with h/o fibroids and has had UFE and pain is much imrpoved. LMP 3/23 and prior to that was 12/22. Having hot flashes and poor libido and difficulty sleeping.  The following portions of the patient's history were reviewed and updated as appropriate: allergies, current medications, past family history, past medical history, past social history, past surgical history, and problem list.  Review of Systems Pertinent items noted in HPI and remainder of comprehensive ROS otherwise negative.   Objective:    BP (!) 148/81   Pulse 73   Ht '5\' 1"'$  (1.549 m)   Wt 165 lb (74.8 kg)   LMP 08/25/2021 (Approximate)   BMI 31.18 kg/m  General appearance: alert, cooperative, and appears stated age Head: Normocephalic, without obvious abnormality, atraumatic Neck: no adenopathy, supple, symmetrical, trachea midline, and thyroid not enlarged, symmetric, no tenderness/mass/nodules Lungs: clear to auscultation bilaterally Breasts: normal appearance, no masses or tenderness Heart: regular rate and rhythm, S1, S2 normal, no murmur, click, rub or gallop Abdomen: soft, non-tender; bowel sounds normal; no masses,  no organomegaly Pelvic: cervix normal in appearance, external genitalia normal, no adnexal masses or tenderness, no cervical motion tenderness, vagina normal without discharge, and uterus is 12-14 week size and firm Extremities: extremities normal, atraumatic, no cyanosis or edema Pulses: 2+ and symmetric Skin: Skin color, texture, turgor normal. No rashes or lesions Lymph nodes: Cervical, supraclavicular, and axillary nodes normal. Neurologic: Grossly normal    Assessment:    GYN female exam.      Plan:  Screening for malignant neoplasm of cervix - 23343 - pap is due  Encounter for gynecological examination without abnormal finding -  56861 - annual labs, mammogram is scheduled 12/08/2021 - Plan: TSH, Follicle stimulating hormone, CBC, VITAMIN D 25 Hydroxy (Vit-D Deficiency, Fractures), Cytology - PAP( )  Drug-induced constipation - 68372 - add miralax daily to help--has tried and failed fiber therapy - Plan: polyethylene glycol (MIRALAX) 17 g packet  Type 2 diabetes mellitus with hyperglycemia, without long-term current use of insulin ([place]) - Check A1C today - Plan: Hemoglobin A1c, Lipid panel  Return in 1 year (on 11/19/2022).    See After Visit Summary for Counseling Recommendations

## 2021-11-19 LAB — CYTOLOGY - PAP
Adequacy: ABSENT
Comment: NEGATIVE
Diagnosis: NEGATIVE
High risk HPV: NEGATIVE

## 2021-12-06 ENCOUNTER — Other Ambulatory Visit (HOSPITAL_BASED_OUTPATIENT_CLINIC_OR_DEPARTMENT_OTHER): Payer: Self-pay | Admitting: Family Medicine

## 2021-12-06 DIAGNOSIS — Z1231 Encounter for screening mammogram for malignant neoplasm of breast: Secondary | ICD-10-CM

## 2021-12-08 ENCOUNTER — Other Ambulatory Visit: Payer: Self-pay | Admitting: Obstetrics and Gynecology

## 2021-12-08 DIAGNOSIS — Z1231 Encounter for screening mammogram for malignant neoplasm of breast: Secondary | ICD-10-CM

## 2022-01-05 LAB — HEMOGLOBIN A1C: Hemoglobin A1C: 6

## 2022-04-11 LAB — COMPREHENSIVE METABOLIC PANEL: eGFR: 64

## 2023-01-31 IMAGING — US US PELVIS COMPLETE WITH TRANSVAGINAL
1 series · 13 of 25 positions shown · non-contrast
Comparison: 07/28/2020

CLINICAL DATA: Enlarged fibroid uterus

EXAM:
TRANSABDOMINAL AND TRANSVAGINAL ULTRASOUND OF PELVIS
TECHNIQUE: Both transabdominal and transvaginal ultrasound examinations of the
pelvis were performed. Transabdominal technique was performed for
global imaging of the pelvis including uterus, ovaries, adnexal
regions, and pelvic cul-de-sac. It was necessary to proceed with
endovaginal exam following the transabdominal exam to visualize the
endometrium and adnexal structures.

[Series 1: us pelvis complete with transvaginal · 0.24mm/px · 64 acquisitions, 13 frames shown]
[im 1/64]
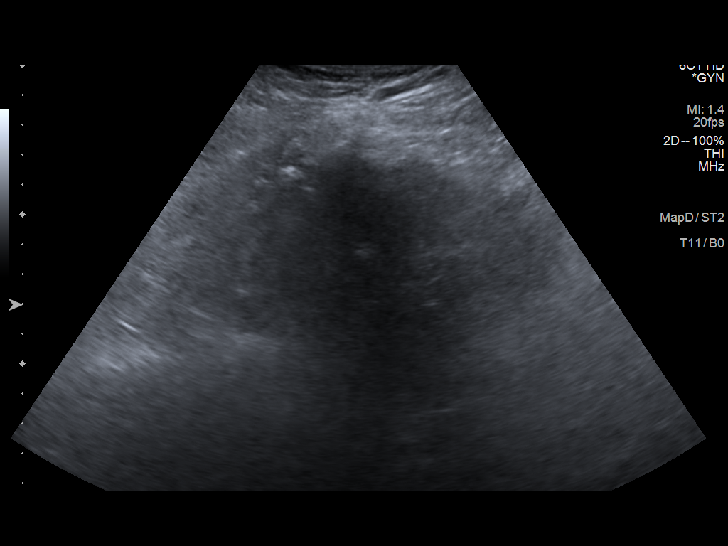
[im 6/64]
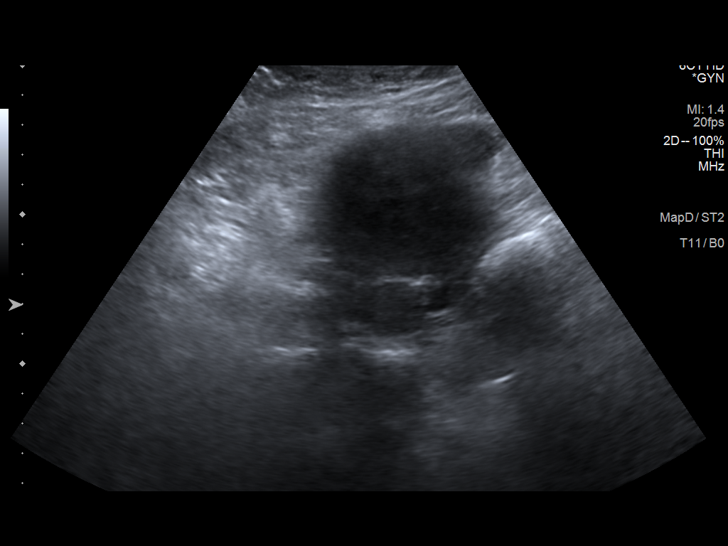
[im 11/64]
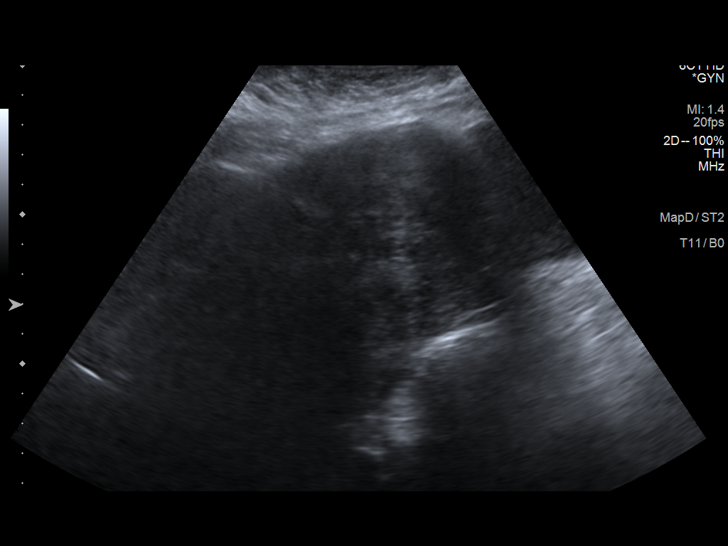
[im 16/64]
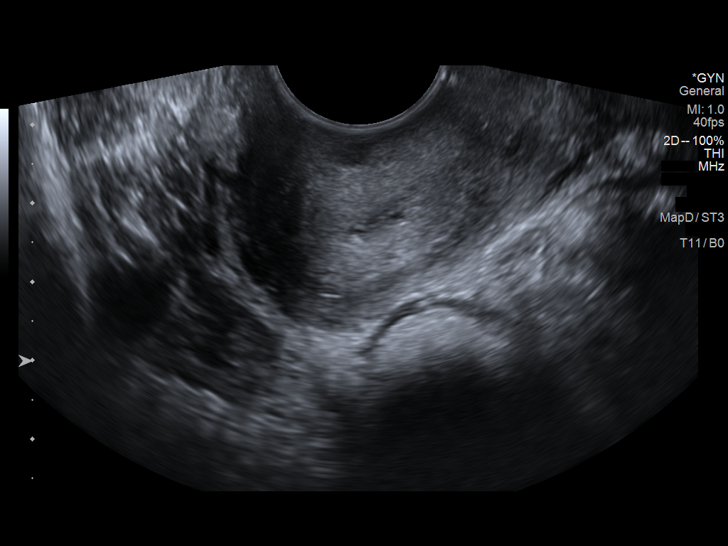
[im 22/64]
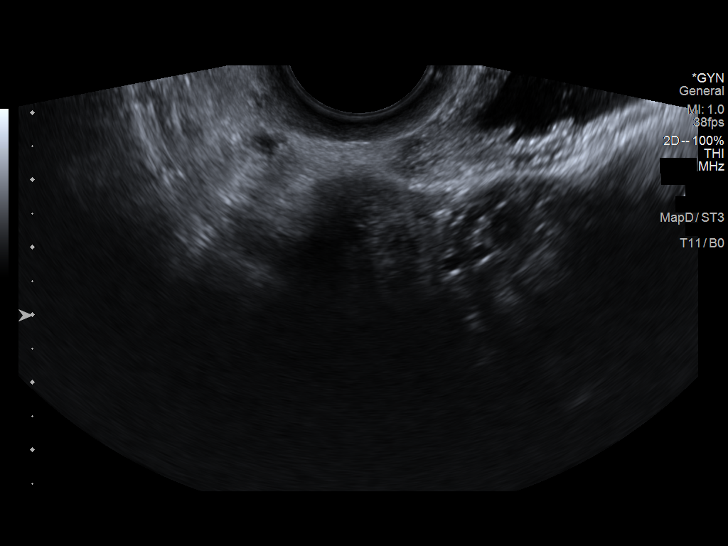
[im 27/64]
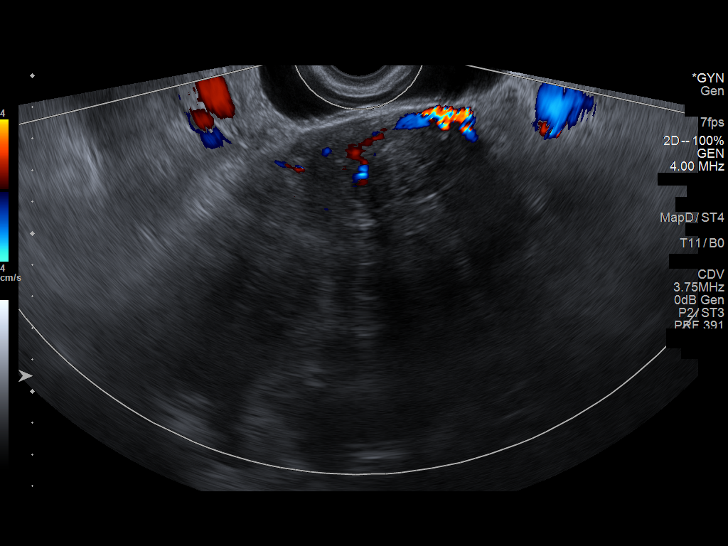
[im 32/64]
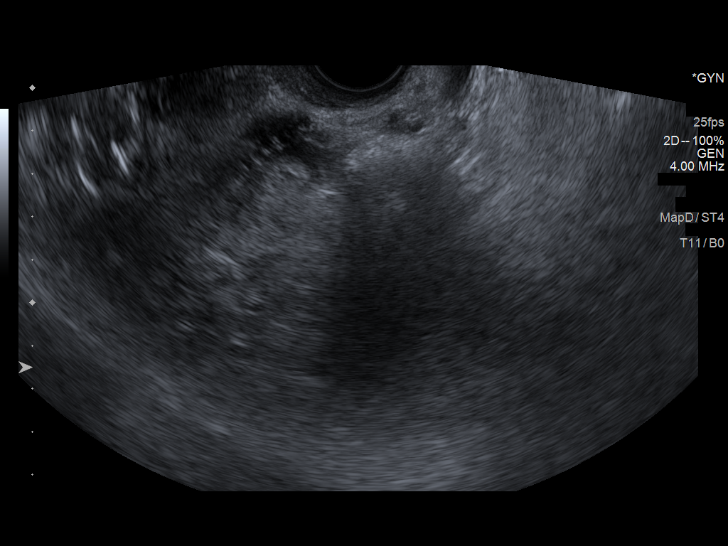
[im 37/64]
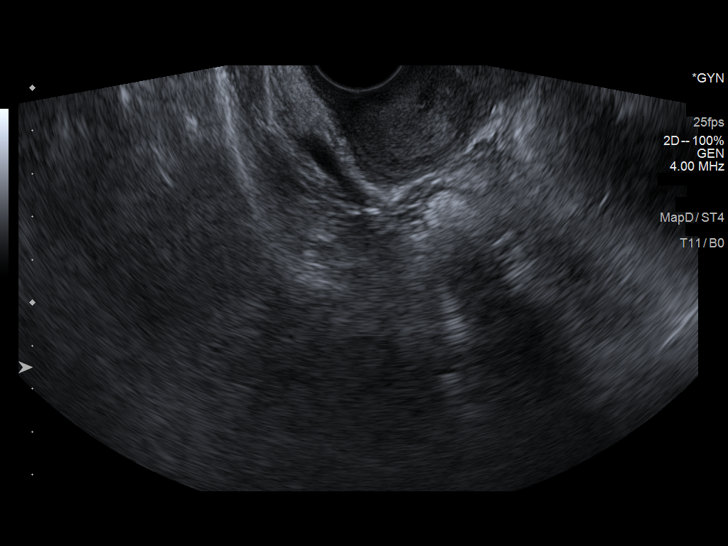
[im 43/64]
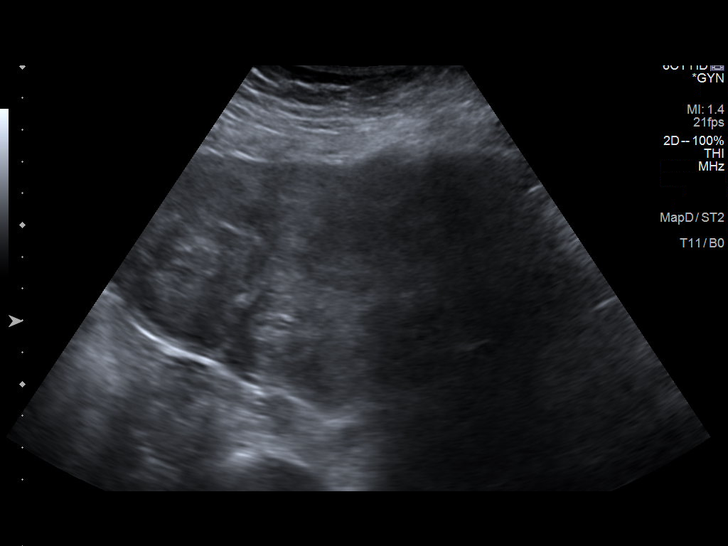
[im 48/64]
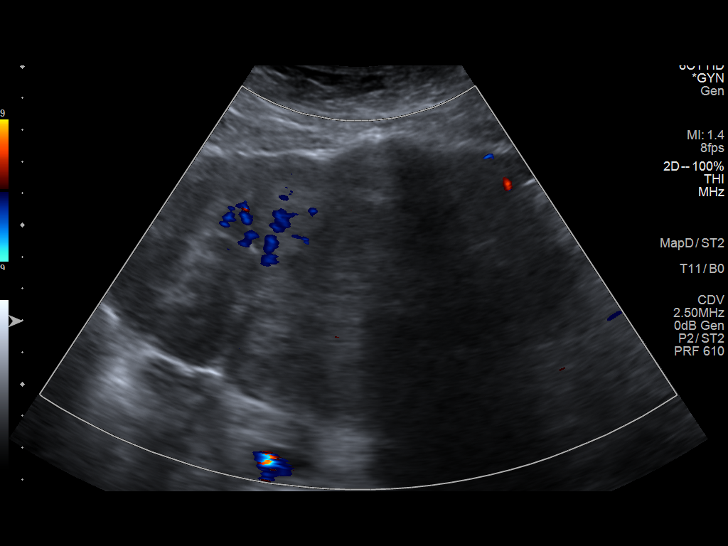
[im 53/64]
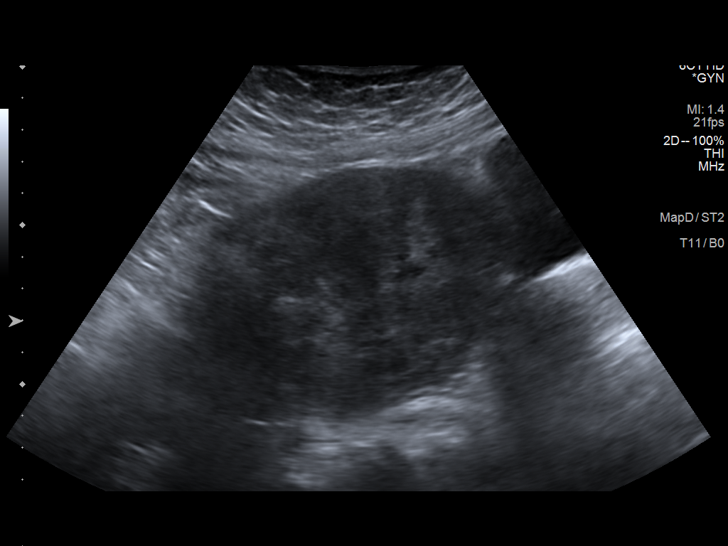
[im 58/64]
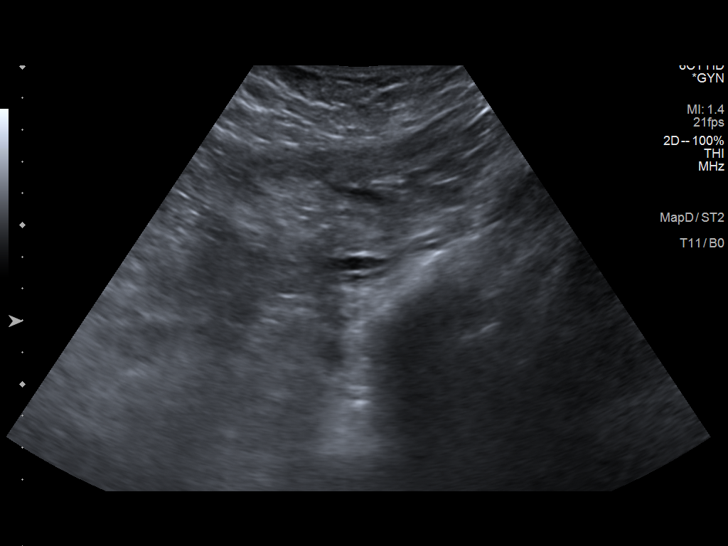
[im 64/64]
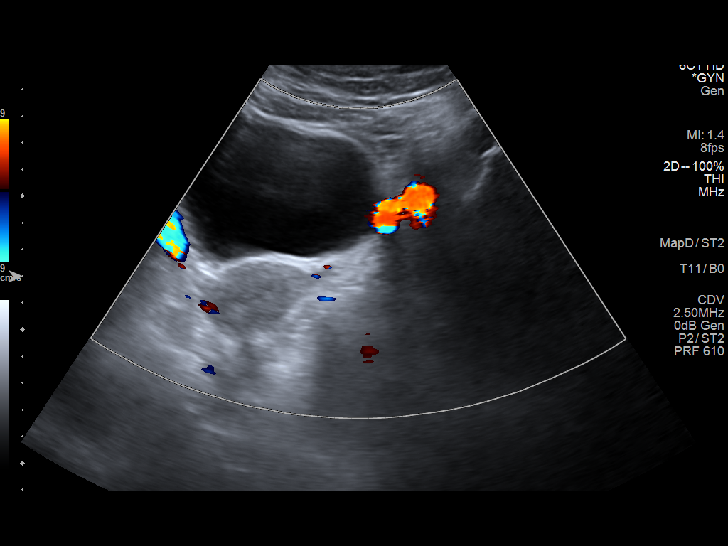

[13 of 25 positions shown; findings below may reference images not displayed]

FINDINGS: Uterus

Measurements: 12.9 x 10.4 by 15.8 cm. The uterus is diffusely
heterogeneous consistent with multiple intramural leiomyomas.
Distinct masses are not well visualized on ultrasound, which is
limited by suboptimal penetration and body habitus. Incidental
nabothian cyst identified within the cervix.

Endometrium

Not well visualized, distorted by multiple uterine fibroids.

Right ovary

Not visualized

Left ovary

Not visualized

Other findings

No free fluid or pelvic mass.
IMPRESSION: 1. Heterogeneously enlarged uterus, consistent with multiple
intrauterine fibroids. Distinct fibroids are not well visualized on
this examination limited by body habitus and suboptimal penetration.
2. Nonvisualization of the endometrium or adnexal structures.

## 2023-03-08 ENCOUNTER — Ambulatory Visit
Admission: RE | Admit: 2023-03-08 | Discharge: 2023-03-08 | Disposition: A | Discharge status: Home / Self Care | Payer: No Typology Code available for payment source | Source: Ambulatory Visit | Attending: Internal Medicine

## 2023-03-08 VITALS — BP 115/70 | HR 65 | Temp 98.2°F | Resp 16

## 2023-03-08 DIAGNOSIS — J0110 Acute frontal sinusitis, unspecified: Secondary | ICD-10-CM | POA: Diagnosis present

## 2023-03-08 DIAGNOSIS — J209 Acute bronchitis, unspecified: Secondary | ICD-10-CM | POA: Insufficient documentation

## 2023-03-08 DIAGNOSIS — R300 Dysuria: Secondary | ICD-10-CM | POA: Insufficient documentation

## 2023-03-08 DIAGNOSIS — Z1152 Encounter for screening for COVID-19: Secondary | ICD-10-CM | POA: Insufficient documentation

## 2023-03-08 LAB — POCT URINALYSIS DIP (MANUAL ENTRY)
Bilirubin, UA: NEGATIVE
Blood, UA: NEGATIVE
Glucose, UA: NEGATIVE mg/dL
Ketones, POC UA: NEGATIVE mg/dL
Leukocytes, UA: NEGATIVE
Nitrite, UA: NEGATIVE
Protein Ur, POC: NEGATIVE mg/dL
Spec Grav, UA: 1.025 (ref 1.010–1.025)
Urobilinogen, UA: 1 E.U./dL
pH, UA: 7 (ref 5.0–8.0)

## 2023-03-08 LAB — POCT INFLUENZA A/B
Influenza A, POC: NEGATIVE
Influenza B, POC: NEGATIVE

## 2023-03-08 MED ORDER — BENZONATATE 200 MG PO CAPS
200.0000 mg | ORAL_CAPSULE | Freq: Three times a day (TID) | ORAL | 0 refills | Status: DC | PRN
Start: 2023-03-08 — End: 2023-07-13

## 2023-03-08 MED ORDER — AZITHROMYCIN 250 MG PO TABS: ORAL_TABLET | ORAL | 0 refills | Status: DC

## 2023-03-08 MED ORDER — KETOROLAC TROMETHAMINE 30 MG/ML IJ SOLN
30.0000 mg | Freq: Once | INTRAMUSCULAR | Status: AC
  Administered 2023-03-08: 30 mg via INTRAMUSCULAR

## 2023-03-08 NOTE — ED Provider Notes (Signed)
[facility]  Note:  This document was prepared using Dragon voice recognition software and may include unintentional dictation errors.  MRN: [id number] DOB: June 24, 1975 DATE: 03/08/23   Subjective:  Chief Complaint:  Chief Complaint  Patient presents with   URI    Entered by patient   Cough     HPI: Shelley Smith is a 47 y.o. female presenting for multiple complaints. Patient first reports dry cough and headache for the past 5 days. Reports symptoms started Saturday with a slight sore throat and post nasal drip. She states she has since developed a frequent, dry cough as well as a headache. She reports myalgias and chills. She has taken Nyquil and Ibuprofen for her symptoms with slight relief. Reports multiple sick contacts.   Patient also presents for dysuria prior to onset of URI symptoms. Reports pressure with urination as well as a fullness in her lower abdomen. Denies fever, vomiting, abdominal pain, low back pain. Endorses nausea, sore throat, fatigue, headache, dry cough. Presents NAD.  Prior to Admission medications   Medication Sig Start Date End Date Taking? Authorizing Provider  acetaminophen (TYLENOL) 500 MG tablet Take 1,000 mg by mouth every 6 (six) hours as needed for moderate pain.    [provider]  cholecalciferol (VITAMIN D3) 25 MCG (1000 UNIT) tablet Take 1,000 Units by mouth daily.    [provider]  docusate sodium (COLACE) 100 MG capsule Take 1 capsule (100 mg total) by mouth 2 (two) times daily. Patient not taking: Reported on 11/18/2021 11/27/20   Han, Aimee H, PA-C  ferrous sulfate 324 MG TBEC Take 324 mg by mouth.    [provider]  ibuprofen (ADVIL) 600 MG tablet Take 1 tablet (600 mg total) by mouth every 6 (six) hours. 11/27/20   Han, Aimee H, PA-C  MOUNJARO 2.5 MG/0.5ML Pen SMARTSIG:2.5 Milligram(s) SUB-Q Once a Week 06/08/21   [provider]  polyethylene glycol (MIRALAX) 17 g packet Take 17 g by mouth daily.  11/18/21   Reva Bores, MD     Allergies  Allergen Reactions   Codeine Anaphylaxis and Swelling    Throat swells Throat swells Throat swells Throat swells    Other Nausea And Vomiting    Lobster and crab causes nausea and vomiting Lobster and crab causes nausea and vomiting Lobster and crab causes nausea and vomiting   Penicillin G Anaphylaxis   Penicillins Anaphylaxis    History:   Past Medical History:  Diagnosis Date   Diabetes mellitus without complication ([facility])      Past Surgical History:  Procedure Laterality Date   CESAREAN SECTION     CHOLECYSTECTOMY     IR ANGIOGRAM PELVIS SELECTIVE OR SUPRASELECTIVE  11/26/2020   IR ANGIOGRAM SELECTIVE EACH ADDITIONAL VESSEL  11/26/2020   IR ANGIOGRAM SELECTIVE EACH ADDITIONAL VESSEL  11/26/2020   IR EMBO TUMOR ORGAN ISCHEMIA INFARCT INC GUIDE ROADMAPPING  11/26/2020   IR RADIOLOGIST EVAL & MGMT  10/22/2020   IR RADIOLOGIST EVAL & MGMT  12/24/2020   IR RADIOLOGIST EVAL & MGMT  08/06/2021   IR US GUIDE VASC ACCESS LEFT  11/26/2020   IR US GUIDE VASC ACCESS RIGHT  11/26/2020   TONSILLECTOMY     TUBAL LIGATION      Family History  Problem Relation Age of Onset   Stroke Father    Cancer Maternal Uncle     Social History   Tobacco Use   Smoking status: Never   Smokeless tobacco: Never  Vaping  Use   Vaping status: Never Used  Substance Use Topics   Alcohol use: Not Currently   Drug use: Never    Review of Systems  Constitutional:  Positive for chills and fatigue. Negative for fever.  HENT:  Positive for congestion, postnasal drip, rhinorrhea and sore throat.   Respiratory:  Positive for cough.   Gastrointestinal:  Positive for nausea. Negative for abdominal pain and vomiting.  Genitourinary:  Positive for dysuria.  Musculoskeletal:  Negative for back pain.  Neurological:  Positive for headaches.     Objective:   Vitals: BP 115/70 (BP Location: Right Arm)   Pulse 65   Temp 98.2 F (36.8 C) (Oral)   Resp 16    LMP 08/25/2021 (Approximate)   SpO2 97%   Physical Exam Constitutional:      General: She is not in acute distress.    Appearance: Normal appearance. She is well-developed and overweight. She is not ill-appearing or toxic-appearing.  HENT:     Head: Normocephalic and atraumatic.     Mouth/Throat:     Pharynx: Oropharynx is clear. Uvula midline. No pharyngeal swelling, oropharyngeal exudate or posterior oropharyngeal erythema.     Tonsils: No tonsillar exudate or tonsillar abscesses.  Cardiovascular:     Rate and Rhythm: Normal rate and regular rhythm.     Heart sounds: Normal heart sounds.  Pulmonary:     Effort: Pulmonary effort is normal.     Breath sounds: Normal breath sounds.     Comments: Clear to auscultation bilaterally  Abdominal:     General: Bowel sounds are normal.     Palpations: Abdomen is soft.     Tenderness: There is no abdominal tenderness. There is no right CVA tenderness or left CVA tenderness.  Skin:    General: Skin is warm and dry.  Neurological:     General: No focal deficit present.     Mental Status: She is alert.  Psychiatric:        Mood and Affect: Mood and affect normal.     Results:  Labs: Results for orders placed or performed during the hospital encounter of 03/08/23 (from the past 24 hour(s))  POCT urinalysis dipstick     Status: None   Collection Time: 03/08/23 10:51 AM  Result Value Ref Range   Color, UA yellow yellow   Clarity, UA clear clear   Glucose, UA negative negative mg/dL   Bilirubin, UA negative negative   Ketones, POC UA negative negative mg/dL   Spec Grav, UA 2.841 3.244 - 1.025   Blood, UA negative negative   pH, UA 7.0 5.0 - 8.0   Protein Ur, POC negative negative mg/dL   Urobilinogen, UA 1.0 0.2 or 1.0 E.U./dL   Nitrite, UA Negative Negative   Leukocytes, UA Negative Negative  POCT Influenza A/B     Status: None   Collection Time: 03/08/23 11:04 AM  Result Value Ref Range   Influenza A, POC Negative Negative    Influenza B, POC Negative Negative    Radiology: No results found.   UC Course/Treatments:  Procedures: Procedures   Medications Ordered in UC: Medications  ketorolac (TORADOL) 30 MG/ML injection 30 mg (has no administration in time range)     Assessment and Plan :     ICD-10-CM   1. Acute bronchitis, unspecified organism  J20.9 SARS CORONAVIRUS 2 (TAT 6-24 HRS) Anterior Nasal Swab    SARS CORONAVIRUS 2 (TAT 6-24 HRS) Anterior Nasal Swab    2. Dysuria  R30.0  3. Acute non-recurrent frontal sinusitis  J01.10      Acute bronchitis, unspecified organism Afebrile, nontoxic-appearing, NAD. VSS. DDX includes but not limited to: bronchitis, pneumonia, COVID, flu Flu was negative today in office. COVID is pending. Given worsening of symptoms and no improvement, Zithromax as directed was prescribed as well as benzonatate 200mg  TID PRN for cough. Strict ED precautions were given and patient verbalized understanding.  Acute non-recurrent frontal sinusitis Afebrile, nontoxic-appearing, NAD. VSS. DDX includes but not limited to: sinusitis, allergic rhinitis, COVID, flu Flu was negative today in office. COVID is pending. Given worsening of symptoms and no improvement, Zithromax as directed was prescribed. Toradol 30mg  IM was given for headache secondary to sinusitis in office today. Strict ED precautions were given and patient verbalized understanding.   Dysuria Afebrile, nontoxic-appearing, NAD. VSS. DDX includes but not limited to: cystitis, pyelonephritis, IC, secondary to diabetes UA was negative today in office. Recommend oral hydration and follow up with PCP if symptoms continue. Strict ED precautions were given and patient verbalized understanding.  ED Discharge Orders          Ordered    azithromycin (ZITHROMAX Z-PAK) 250 MG tablet        03/08/23 1106    benzonatate (TESSALON) 200 MG capsule  3 times daily PRN        03/08/23 1106             PDMP not reviewed  this encounter.     Cynda Acres, PA-C 03/08/23 1112

## 2023-03-08 NOTE — ED Triage Notes (Signed)
Pt c/o dry frequent cough, chest and ribs are sore, HA last few days, body aches, hot and cold chills, N, pressure with urination starting Saturday night. Pt reports some shortness of breath when laying down. Sxs are worse at night.  Denies: burning with urination, D

## 2023-03-08 NOTE — Discharge Instructions (Addendum)
Your flu test was negative and your COVID test has been sent to the lab. Someone from our office will call you with your results. A prescription was sent for Zithromax. This is an antibiotic used to treat upper respiratory infections. Take as directed.  You were given an injection (Toradol) for your headache today. This will help with the pain. You should avoid any further NSAIDs today given your injection in office today. This includes Ibuprofen, Motrin, Aleve, Advil, Mobic, ect.     Return in 2 to 3 days if no improvement. It is very important for you to pay attention to any new symptoms or worsening of your current condition. Please go directly to the Emergency Department immediately should you begin to have any of the following symptoms: shortness of breath, chest pain or difficulty breathing.

## 2023-03-09 LAB — SARS CORONAVIRUS 2 (TAT 6-24 HRS): SARS Coronavirus 2: NEGATIVE

## 2023-03-13 ENCOUNTER — Ambulatory Visit (INDEPENDENT_AMBULATORY_CARE_PROVIDER_SITE_OTHER): Payer: Self-pay | Admitting: Family Medicine

## 2023-03-13 DIAGNOSIS — Z91199 Patient's noncompliance with other medical treatment and regimen due to unspecified reason: Secondary | ICD-10-CM

## 2023-03-14 NOTE — Progress Notes (Signed)
No show

## 2023-03-28 ENCOUNTER — Ambulatory Visit: Payer: PRIVATE HEALTH INSURANCE | Admitting: Family Medicine

## 2023-04-06 DIAGNOSIS — Z1231 Encounter for screening mammogram for malignant neoplasm of breast: Secondary | ICD-10-CM

## 2023-07-13 ENCOUNTER — Ambulatory Visit: Payer: Medicaid Other | Admitting: Obstetrics and Gynecology

## 2023-07-13 ENCOUNTER — Other Ambulatory Visit (HOSPITAL_COMMUNITY)
Admission: RE | Admit: 2023-07-13 | Discharge: 2023-07-13 | Disposition: A | Discharge status: Home / Self Care | Payer: Medicaid Other | Source: Ambulatory Visit | Attending: Obstetrics and Gynecology | Admitting: Obstetrics and Gynecology

## 2023-07-13 ENCOUNTER — Encounter: Payer: Self-pay | Admitting: Obstetrics and Gynecology

## 2023-07-13 VITALS — BP 153/95 | HR 68 | Ht 61.0 in | Wt 171.0 lb

## 2023-07-13 DIAGNOSIS — Z113 Encounter for screening for infections with a predominantly sexual mode of transmission: Secondary | ICD-10-CM | POA: Diagnosis present

## 2023-07-13 DIAGNOSIS — N941 Unspecified dyspareunia: Secondary | ICD-10-CM | POA: Diagnosis not present

## 2023-07-13 DIAGNOSIS — B851 Pediculosis due to Pediculus humanus corporis: Secondary | ICD-10-CM | POA: Diagnosis present

## 2023-07-13 DIAGNOSIS — N898 Other specified noninflammatory disorders of vagina: Secondary | ICD-10-CM

## 2023-07-13 DIAGNOSIS — N939 Abnormal uterine and vaginal bleeding, unspecified: Secondary | ICD-10-CM | POA: Diagnosis present

## 2023-07-13 NOTE — Progress Notes (Signed)
GYNECOLOGY OFFICE VISIT NOTE  History:   Shelley Smith is a 48 y.o. [phone] here today for vaginal bleeding. The bleeding is light and only recently started. She has not had bleeding basically since her [place] which was in 2022. She also notes some pain with intercourse which is a newer development.    Past Medical History:  Diagnosis Date   Diabetes mellitus without complication (HCC)     The following portions of the patient's history were reviewed and updated as appropriate: allergies, current medications, past family history, past medical history, past social history, past surgical history and problem list.   Health Maintenance:   Diagnosis  Date Value Ref Range Status  11/18/2021   Final   - Negative for intraepithelial lesion or malignancy (NILM)     Normal mammogram on 08/2020.   Review of Systems:  Pertinent items noted in HPI and remainder of comprehensive ROS otherwise negative.  Physical Exam:  BP (!) 153/95   Pulse 68   Ht 5\' 1"  (1.549 m)   Wt 171 lb (77.6 kg)   LMP 08/25/2021 (Approximate)   BMI 32.31 kg/m  CONSTITUTIONAL: Well-developed, well-nourished female in no acute distress.  HEENT:  Normocephalic, atraumatic. External right and left ear normal. No scleral icterus.  NECK: Normal range of motion, supple, no masses noted on observation SKIN: No rash noted. Not diaphoretic. No erythema. No pallor. MUSCULOSKELETAL: Normal range of motion. No edema noted. NEUROLOGIC: Alert and oriented to person, place, and time. Normal muscle tone coordination. No cranial nerve deficit noted. PSYCHIATRIC: Normal mood and affect. Normal behavior. Normal judgment and thought content.  PELVIC: Normal appearing external genitalia; normal urethral meatus; normal appearing vaginal mucosa and cervix.  Brown discharge noted. Cervix was normal but atrophic and stenotic.  Performed in the presence of a chaperone    GYNECOLOGY OFFICE PROCEDURE NOTE   Shelley Smith is a 48 y.o.  [id number] here for endometrial biopsy for AUB.   ENDOMETRIAL BIOPSY     The indications for endometrial biopsy were reviewed.   Risks of the biopsy including cramping, bleeding, infection, uterine perforation, inadequate specimen and need for additional procedures were discussed. Offered alternative of hysteroscopy, dilation and curettage in OR. The patient states she understands the R/B/I/A and agrees to undergo procedure today. Urine pregnancy test was Not indicated. Consent was signed. Time out was performed.    Patient was positioned in dorsal lithotomy position. A vaginal speculum was placed.  The cervix was visualized and was prepped with Betadine.  Cervix was then numbed with topical lidocaine followed by 4 cc of 1% lidocaine with epinephrine. A single-toothed tenaculum was placed on the anterior lip of the cervix to stabilize it. The cervix was dilated with os finders. The pipelle went to a depth of 5.5 cm. I then used the small cervical dilators and could only reach a depth similar to the pipelle. Pt felt cramping when I tried to move beyond 5.5 cm. .he 3 mm pipelle was easily introduced into the endometrial cavity without difficulty to a depth of 5.5 cm, and a Scant amount of tissue was obtained after three passes and sent to pathology. The instruments were removed from the patient's vagina. Minimal bleeding from the cervix was noted. The patient tolerated the procedure well. Assessment and Plan:  Diagnoses and all orders for this visit:  Vaginal bleeding Given stenotic cervix, vaginal bleeding may be from vaginal infection and not from uterus. Nevertheless EMB done today. Unclear if she is post or perimenopausal  for this we will check FSH. Discussed that if EMB is without endometrial cells, then I would recommend an [place]. We have waited on [place] as first-line evaluation at this time due to her history of fibroids.  [facility] -     Surgical pathology  Vaginal discharge May be the cause of her  bleeding. Culture sent to rule out infection.  -     Cervicovaginal ancillary only( [place])  Screening examination for STI -     Cervicovaginal ancillary only( [place])  Dyspareunia in female May be due to being postmenopausal or may be due to infection. Checked swabs today. Samples given for silicone based lubricant.    Routine preventative health maintenance measures emphasized. Please refer to After Visit Summary for other counseling recommendations.   Return if symptoms worsen or fail to improve.  Milas Hock, MD, FACOG Obstetrician & Gynecologist, [facility] for [facility], [facility]

## 2023-07-14 ENCOUNTER — Encounter: Payer: Self-pay | Admitting: Obstetrics and Gynecology

## 2023-07-14 LAB — CERVICOVAGINAL ANCILLARY ONLY
Bacterial Vaginitis (gardnerella): NEGATIVE
Candida Glabrata: NEGATIVE
Candida Vaginitis: NEGATIVE
Chlamydia: NEGATIVE
Comment: NEGATIVE
Comment: NEGATIVE
Comment: NEGATIVE
Comment: NEGATIVE
Comment: NEGATIVE
Comment: NORMAL
Neisseria Gonorrhea: NEGATIVE
Trichomonas: NEGATIVE

## 2023-07-14 LAB — SURGICAL PATHOLOGY

## 2023-07-14 LAB — FOLLICLE STIMULATING HORMONE: FSH: 63.9 m[IU]/mL

## 2023-07-18 ENCOUNTER — Other Ambulatory Visit: Payer: Self-pay | Admitting: Obstetrics and Gynecology

## 2023-07-18 DIAGNOSIS — N939 Abnormal uterine and vaginal bleeding, unspecified: Secondary | ICD-10-CM

## 2023-07-19 ENCOUNTER — Ambulatory Visit: Payer: Medicaid Other

## 2023-07-19 DIAGNOSIS — N939 Abnormal uterine and vaginal bleeding, unspecified: Secondary | ICD-10-CM

## 2023-07-20 ENCOUNTER — Encounter: Payer: Self-pay | Admitting: Obstetrics and Gynecology

## 2023-08-09 ENCOUNTER — Telehealth: Payer: Self-pay | Admitting: *Deleted

## 2023-08-09 NOTE — Telephone Encounter (Signed)
-----   Message from [place] sent at 08/08/2023  1:41 PM EST ----- Can you bring her back in to try EMB again (vs discussing doing it in the OR)? Thanks, pad

## 2023-08-09 NOTE — Telephone Encounter (Signed)
 Left patient a message to call and schedule Endo Bio with Dr. Para March per Dr. Berton Lan.

## 2024-01-25 ENCOUNTER — Other Ambulatory Visit: Payer: Self-pay | Admitting: Obstetrics and Gynecology

## 2024-01-25 ENCOUNTER — Encounter: Payer: Self-pay | Admitting: Family Medicine

## 2024-01-25 DIAGNOSIS — Z1231 Encounter for screening mammogram for malignant neoplasm of breast: Secondary | ICD-10-CM

## 2024-02-22 ENCOUNTER — Ambulatory Visit (INDEPENDENT_AMBULATORY_CARE_PROVIDER_SITE_OTHER)

## 2024-02-22 DIAGNOSIS — Z1231 Encounter for screening mammogram for malignant neoplasm of breast: Secondary | ICD-10-CM
